# Patient Record
Sex: Female | Born: 1945 | Race: White | Hispanic: No | Marital: Married | Smoking: Never smoker
Health system: Southern US, Community
[De-identification: ages and names within clinical notes are randomized; demographics above are authoritative.]

## PROBLEM LIST (undated history)

## (undated) DIAGNOSIS — L570 Actinic keratosis: Secondary | ICD-10-CM

## (undated) HISTORY — DX: Actinic keratosis: L57.0

---

## 2004-11-20 ENCOUNTER — Ambulatory Visit: Payer: Self-pay | Admitting: Obstetrics and Gynecology

## 2005-11-23 ENCOUNTER — Ambulatory Visit: Payer: Self-pay | Admitting: Obstetrics and Gynecology

## 2006-12-06 ENCOUNTER — Ambulatory Visit: Payer: Self-pay | Admitting: Obstetrics and Gynecology

## 2007-12-12 ENCOUNTER — Ambulatory Visit: Payer: Self-pay | Admitting: Obstetrics and Gynecology

## 2008-12-16 ENCOUNTER — Ambulatory Visit: Payer: Self-pay | Admitting: Obstetrics and Gynecology

## 2009-12-22 ENCOUNTER — Ambulatory Visit: Payer: Self-pay | Admitting: Obstetrics and Gynecology

## 2010-12-29 ENCOUNTER — Ambulatory Visit: Payer: Self-pay | Admitting: Obstetrics and Gynecology

## 2011-11-26 ENCOUNTER — Ambulatory Visit: Payer: Self-pay | Admitting: Obstetrics and Gynecology

## 2012-09-26 ENCOUNTER — Emergency Department: Payer: Self-pay | Admitting: Emergency Medicine

## 2012-09-26 LAB — BASIC METABOLIC PANEL
Anion Gap: 5 — ABNORMAL LOW (ref 7–16)
BUN: 22 mg/dL — ABNORMAL HIGH (ref 7–18)
Calcium, Total: 8.6 mg/dL (ref 8.5–10.1)
Chloride: 108 mmol/L — ABNORMAL HIGH (ref 98–107)
Co2: 29 mmol/L (ref 21–32)
Creatinine: 0.78 mg/dL (ref 0.60–1.30)
EGFR (African American): >60
EGFR (Non-African Amer.): >60
Glucose: 95 mg/dL (ref 65–99)
Osmolality: 286 (ref 275–301)
Potassium: 3.6 mmol/L (ref 3.5–5.1)
Sodium: 142 mmol/L (ref 136–145)

## 2012-09-26 LAB — CBC
HCT: 34.6 % — ABNORMAL LOW (ref 35.0–47.0)
HGB: 12.1 g/dL (ref 12.0–16.0)
MCH: 32.6 pg (ref 26.0–34.0)
MCHC: 35.1 g/dL (ref 32.0–36.0)
MCV: 93 fL (ref 80–100)
Platelet: 214 10*3/uL (ref 150–440)
RBC: 3.72 10*6/uL — ABNORMAL LOW (ref 3.80–5.20)
RDW: 13.1 % (ref 11.5–14.5)
WBC: 7 10*3/uL (ref 3.6–11.0)

## 2012-09-26 LAB — TROPONIN I
Troponin-I: <0.02 ng/mL
Troponin-I: <0.02 ng/mL

## 2012-10-05 ENCOUNTER — Ambulatory Visit: Payer: Self-pay | Admitting: Unknown Physician Specialty

## 2012-10-06 LAB — COMPREHENSIVE METABOLIC PANEL
Albumin: 4 g/dL (ref 3.4–5.0)
Alkaline Phosphatase: 70 U/L (ref 50–136)
Anion Gap: 5 — ABNORMAL LOW (ref 7–16)
BUN: 23 mg/dL — ABNORMAL HIGH (ref 7–18)
Bilirubin,Total: 0.3 mg/dL (ref 0.2–1.0)
Calcium, Total: 8.7 mg/dL (ref 8.5–10.1)
Chloride: 107 mmol/L (ref 98–107)
Co2: 26 mmol/L (ref 21–32)
Creatinine: 0.89 mg/dL (ref 0.60–1.30)
EGFR (African American): >60
EGFR (Non-African Amer.): >60
Glucose: 148 mg/dL — ABNORMAL HIGH (ref 65–99)
Osmolality: 282 (ref 275–301)
Potassium: 4 mmol/L (ref 3.5–5.1)
SGOT(AST): 19 U/L (ref 15–37)
SGPT (ALT): 28 U/L (ref 12–78)
Sodium: 138 mmol/L (ref 136–145)
Total Protein: 7 g/dL (ref 6.4–8.2)

## 2012-10-06 LAB — CBC
HCT: 33 % — ABNORMAL LOW (ref 35.0–47.0)
HGB: 11.3 g/dL — ABNORMAL LOW (ref 12.0–16.0)
MCH: 32 pg (ref 26.0–34.0)
MCHC: 34.1 g/dL (ref 32.0–36.0)
MCV: 94 fL (ref 80–100)
Platelet: 225 10*3/uL (ref 150–440)
RBC: 3.51 10*6/uL — ABNORMAL LOW (ref 3.80–5.20)
RDW: 13.2 % (ref 11.5–14.5)
WBC: 9.7 10*3/uL (ref 3.6–11.0)

## 2012-10-06 LAB — PATHOLOGY REPORT

## 2012-10-07 ENCOUNTER — Inpatient Hospital Stay: Payer: Self-pay | Admitting: Internal Medicine

## 2012-10-07 LAB — HEMOGLOBIN
HGB: 10.3 g/dL — ABNORMAL LOW (ref 12.0–16.0)
HGB: 8.3 g/dL — ABNORMAL LOW (ref 12.0–16.0)

## 2012-10-07 LAB — PROTIME-INR
INR: 1
Prothrombin Time: 13.4 secs (ref 11.5–14.7)

## 2012-10-08 LAB — CBC WITH DIFFERENTIAL/PLATELET
Basophil #: 0 10*3/uL (ref 0.0–0.1)
Basophil %: 0.9 %
Eosinophil #: 0.1 10*3/uL (ref 0.0–0.7)
Eosinophil %: 2.9 %
HCT: 22.7 % — ABNORMAL LOW (ref 35.0–47.0)
HGB: 7.8 g/dL — ABNORMAL LOW (ref 12.0–16.0)
Lymphocyte #: 1.5 10*3/uL (ref 1.0–3.6)
Lymphocyte %: 29.7 %
MCH: 31.8 pg (ref 26.0–34.0)
MCHC: 34.3 g/dL (ref 32.0–36.0)
MCV: 93 fL (ref 80–100)
Monocyte #: 0.4 x10 3/mm (ref 0.2–0.9)
Monocyte %: 7.8 %
Neutrophil #: 3 10*3/uL (ref 1.4–6.5)
Neutrophil %: 58.7 %
Platelet: 167 10*3/uL (ref 150–440)
RBC: 2.45 10*6/uL — ABNORMAL LOW (ref 3.80–5.20)
RDW: 13.4 % (ref 11.5–14.5)
WBC: 5.1 10*3/uL (ref 3.6–11.0)

## 2012-10-08 LAB — BASIC METABOLIC PANEL
Anion Gap: 5 — ABNORMAL LOW (ref 7–16)
BUN: 13 mg/dL (ref 7–18)
Calcium, Total: 7.7 mg/dL — ABNORMAL LOW (ref 8.5–10.1)
Chloride: 112 mmol/L — ABNORMAL HIGH (ref 98–107)
Co2: 28 mmol/L (ref 21–32)
Creatinine: 0.89 mg/dL (ref 0.60–1.30)
EGFR (African American): >60
EGFR (Non-African Amer.): >60
Glucose: 92 mg/dL (ref 65–99)
Osmolality: 288 (ref 275–301)
Potassium: 3.5 mmol/L (ref 3.5–5.1)
Sodium: 145 mmol/L (ref 136–145)

## 2012-10-08 LAB — HEMOGLOBIN
HGB: 8 g/dL — ABNORMAL LOW (ref 12.0–16.0)
HGB: 7.7 g/dL — ABNORMAL LOW (ref 12.0–16.0)

## 2012-10-09 LAB — HEMOGLOBIN
HGB: 7.6 g/dL — ABNORMAL LOW (ref 12.0–16.0)
HGB: 7.6 g/dL — ABNORMAL LOW (ref 12.0–16.0)

## 2012-10-12 ENCOUNTER — Ambulatory Visit: Payer: Self-pay | Admitting: Specialist

## 2012-11-09 ENCOUNTER — Ambulatory Visit: Payer: Self-pay

## 2012-12-06 ENCOUNTER — Ambulatory Visit: Payer: Self-pay | Admitting: Internal Medicine

## 2013-09-04 DIAGNOSIS — D229 Melanocytic nevi, unspecified: Secondary | ICD-10-CM

## 2013-09-04 HISTORY — DX: Melanocytic nevi, unspecified: D22.9

## 2013-12-25 ENCOUNTER — Ambulatory Visit: Payer: Self-pay | Admitting: Internal Medicine

## 2014-06-18 ENCOUNTER — Ambulatory Visit: Payer: Self-pay | Admitting: General Practice

## 2014-07-19 NOTE — H&P (Signed)
PATIENT NAME:  Courtney Archer, Courtney Archer MR#:  [id number] DATE OF BIRTH:  May 10, 1945  DATE OF ADMISSION:  10/07/2012  PRIMARY CARE PHYSICIAN:  Dr. Ezequiel Kayser.   REFERRING PHYSICIAN:  Dr. Lurline Hare.   CHIEF COMPLAINT:  Bright red blood per rectum.   HISTORY OF PRESENT ILLNESS:  Courtney Archer is a 69 year old pleasant female with past medical history of gastroesophageal reflux disease, osteopenia, underwent a colonoscopy on 10/05/2012, removed a polyp of 1 cm and also a 3 mm sessile polyp.  The patient was also found to have internal hemorrhoids.  The patient did well until the 11th, evening, started to have one episode of bloody stools.  The patient was driving episode of bleeding with clot.  There was no fecal material.  Called Dr. Percell [place] office who did the colonoscopy, recommended to come to the Emergency Department.  After came back home the patient had another episode of bloody stools.  Work-up in the Emergency Department with a CBC, hemoglobin 11.2, which is decreased from 12.  Denies having any abdominal pain, nausea, vomiting.  The patient has normal BUN.   PAST MEDICAL HISTORY: 1.  Gastroesophageal reflux.  2.  Colonoscopy.  3.  Appendectomy.  4.  Tonsillectomy.   ALLERGIES:  No known drug allergies.   HOME MEDICATIONS: 1.  Omeprazole 20 mg 2 times a day.  2.  Lipitor 10 mg 1/2 tablet once a day.  3.  Aspirin 81 mg daily.   SOCIAL HISTORY:  No history of smoking, drinking, alcohol or using illicit drugs.  Married, lives with her husband, retired.   FAMILY HISTORY:  Mother died of bile duct cancer.  Father died of heart problems.   REVIEW OF SYSTEMS: CONSTITUTIONAL:  No generalized weakness, fatigued.  EYES:  No change in vision.  EARS, NOSE, THROAT:  No change in hearing.  No sore throat.  No runny nose. RESPIRATORY:  No cough, shortness of breath.  CARDIOVASCULAR:  No chest pain, palpitations.  No pedal edema.  GASTROINTESTINAL:  No nausea, vomiting.  Has  hematochezia. GENITOURINARY:  No dysuria or hematuria.  SKIN:  No rash or lesions.  ENDOCRINE:  No polyuria or polydipsia.  MUSCULOSKELETAL:  Good range of motion, has no joint pains and aches. NEUROLOGIC:  No weakness or numbness in any part of the body.  PSYCHIATRIC:  No depression.   PHYSICAL EXAMINATION: GENERAL:  This is a well-built, well-nourished, age-appropriate female lying down in the bed, not in distress.  VITAL SIGNS:  Temperature 98.2, pulse 60, blood pressure 136/77, respiratory rate of 16, oxygen saturation 100% on room air.  HEENT:  Head normocephalic, atraumatic.  Eyes, no scleral icterus.  Conjunctivae normal.  Pupils equal and react to light.  Mucous membranes moist.  No pharyngeal erythema.  NECK:  Supple.  No lymphadenopathy.  No JVD.  No carotid bruit.  No thyromegaly.  CHEST:  Has no focal tenderness.  LUNGS:  Bilateral clear to auscultation.  HEART:  S1, S2, regular.  No murmurs are heard.  Lower extremities, no pedal edema.  Pulses 2+ in the dorsalis pedis and posterior tibialis.  ABDOMEN:  Bowel sounds plus.  Soft, nontender, nondistended.  No hepatosplenomegaly.   RECTAL:  Exam done by the ER physician showed bright red blood.  SKIN:  No rash or lesions.  MUSCULOSKELETAL:  Good range of motion in all the extremities.  NEUROLOGIC:  The patient is alert, oriented to place, person and time.  Cranial nerves II through XII intact.  No motor and sensory  deficits.   LABORATORY DATA:  CBC, hemoglobin 11.3.  CMP is completely within normal limits.   ASSESSMENT AND PLAN:  Courtney Archer is a 69 year old female who comes to the Emergency Department with bright red blood per rectum.  1.  Gastrointestinal bleed.  This is most likely from the polyp removal site.  We will continue to follow up with hemoglobin and hematocrit.  Continue with IV fluids.  The patient is currently hemodynamically stable, not tachycardic.  2.  Anemia, from acute blood loss.  We will continue to  follow up.  3.  Keep the patient on DVT prophylaxis with SCDs.   TIME SPENT:  35 minutes.    ____________________________ Monica Becton, MD pv:ea D: 10/07/2012 02:06:11 ET T: 10/07/2012 02:41:10 ET JOB#: [id number]  cc: Monica Becton, MD, <Dictator> Christena Flake. Raechel Ache, MD Grier Mitts Annslee Tercero MD ELECTRONICALLY SIGNED 10/11/2012 0:23

## 2014-07-19 NOTE — Discharge Summary (Signed)
PATIENT NAME:  Courtney Archer, Courtney Archer MR#:  [id number] DATE OF BIRTH:  [birth year]  DATE OF ADMISSION:  10/07/2012 DATE OF DISCHARGE:  10/09/2012  DISCHARGE DIAGNOSES: 1.  Lower gastrointestinal bleeding due to mucosal ulcers, anemia due to gastrointestinal bleed.  2.  Gastroesophageal reflux disease. 3.  Colonoscopy done. Need to follow in 3 to 4 weeks.   CONDITION ON DISCHARGE: Stable.   DISCHARGE MEDICATIONS:  1.  Lipitor 10 mg oral tablet take 1/2 tablet once a day.  2.  Omeprazole 20 mg delayed-release tablet 2 times a day.  3.  Ferrous sulfate 325 mg oral tablet 3 times a day. 4.  Colace 100 mg capsule 2 times a day as needed for constipation.   DIET ON DISCHARGE: Regular. Diet consistency: Regular.   ACTIVITY LIMITATION: As tolerated.   TIMEFRAME TO FOLLOW-UP: Within 2 to 4 weeks with GI clinic. Advised to follow within 3 to 4 weeks with Dr. Vira Agar or Dr. Allen Norris.  HISTORY OF PRESENT ILLNESS: A 69 year old female with past medical history of gastroesophageal reflux disease, osteopenia. She underwent colonoscopy on 07/10 and removed a polyp.  She was also found to have an internal hemorrhoid. She did well for up to 24 hours and then started having episode of bloody stool.  She called Dr. Percell Boston office and they recommended to come to the Emergency Department.  She waited at home for another episode and after the second episode of bloody stools she came to the Emergency Department. In the Emergency Department, her hemoglobin was 11.2, which was decreased from 12. There was no abdominal pain, vomiting.   HOSPITAL COURSE AND STAY: She was admitted to regular medical floor and hemoglobin was followed regularly. GI consult with Dr. Allen Norris was done and he colonoscopy, found blood in entire examined colon, mucosal ulceration.  Injected and treated with thermal therapy.  After that she was started on clear liquids and she tolerated that so we upgraded that slowly and later on she was able to  tolerate the diet and her stool was clearing up. We discharged her home with advice to follow in GI clinic.   OTHER MEDICAL ISSUES IN THIS HOSPITAL STAY:  1.  Anemia due to blood loss.  It was mild and we advised to take oral supplement of iron therapy and get her hemoglobin checked later on with PMD. 2.  Gastroesophageal reflux disease. We continued Nexium orally on discharge.   LAB RESULTS:  Hemoglobin on presentation 11.3. Hemoglobin came down to 8.3 and 7.8 later on and then remained stable around 8.   TOTAL TIME SPENT ON THIS DISCHARGE: 45 minutes.  ____________________________ Ceasar Lund Anselm Jungling, MD vgv:sb D: 10/12/2012 07:42:54 ET T: 10/12/2012 07:54:46 ET JOB#: [id number]  cc: Ceasar Lund. Anselm Jungling, MD, <Dictator> Manya Silvas, MD Lucilla Lame, MD Rosalio Macadamia [facility] MD ELECTRONICALLY SIGNED 10/17/2012 16:08

## 2014-07-19 NOTE — Consult Note (Signed)
Brief Consult Note: Diagnosis: post polypectomy bleed.   Patient was seen by consultant.   Consult note dictated.   Comments: The patient will be prepped for a colonocopy for tomorrow.  Electronic Signatures: Lucilla Lame (MD)  (Signed 12-Jul-14 11:39)  Authored: Brief Consult Note   Last Updated: 12-Jul-14 11:39 by Lucilla Lame (MD)

## 2014-07-19 NOTE — Consult Note (Signed)
Chief Complaint:  Subjective/Chief Complaint Pt denies any abdominal pain, N/V.  Small amt of dark stool in 24 hrs.  Denies SOB, dizziness, CP or palpitations.   VITAL SIGNS/ANCILLARY NOTES: **Vital Signs.:   14-Jul-14 04:22  Vital Signs Type Routine  Temperature Temperature (F) 98.8  Celsius 37.1  Temperature Source oral  Pulse Pulse 64  Respirations Respirations 18  Systolic BP Systolic BP 709  Diastolic BP (mmHg) Diastolic BP (mmHg) 61  Mean BP 80  Pulse Ox % Pulse Ox % 98  Pulse Ox Activity Level  At rest  Oxygen Delivery Room Air/ 21 %   Brief Assessment:  GEN well developed, well nourished, no acute distress, A/Ox3   Cardiac Regular   Respiratory normal resp effort   Gastrointestinal Normal   Gastrointestinal details normal Soft  Nontender  Nondistended  Bowel sounds normal  No rebound tenderness  No gaurding   EXTR negative cyanosis/clubbing, negative edema   Additional Physical Exam Skin: Pink, warm, dry   Lab Results:  Routine Hem:  14-Jul-14 03:29   Hemoglobin (CBC)  7.6 (Result(s) reported on 09 Oct 2012 at 03:57AM.)   Assessment/Plan:  Assessment/Plan:  Assessment Post-polypectomy bleed:  s/p intervention by Dr Allen Norris.  Doing well. Anemia: secondary to acute bleed.  Hgb stable.   Plan 1) Recheck H/H @ 11, if stable consider DC home 2) Begin iron 325mg  daily 3) follow up with Dr Vira Agar in 2-3 weeks 4) pt may resume ASA in 5 DAYS   Electronic Signatures: Andria Meuse (NP)  (Signed 14-Jul-14 14:49)  Authored: Chief Complaint, VITAL SIGNS/ANCILLARY NOTES, Brief Assessment, Lab Results, Assessment/Plan   Last Updated: 14-Jul-14 14:49 by Andria Meuse (NP)

## 2014-07-19 NOTE — Consult Note (Signed)
PATIENT NAME:  Courtney Archer, Courtney Archer MR#:  [id number] DATE OF BIRTH:  03/23/1946  DATE OF CONSULTATION:  10/07/2012  REFERRING PHYSICIAN:   CONSULTING PHYSICIAN:  Lucilla Lame, MD  CONSULTING SERVICE:  Gastroenterology.  REASON FOR CONSULTATION:  Post polypectomy bleed.   HISTORY OF PRESENT ILLNESS:  This patient is a 69 year old woman who comes in after having a colonoscopy on the 10th of this month by Dr. Vira Agar.  At that time the patient had two polyps removed, one with cold snare and one with a hot snare.  The patient reports that the polyps were in the ascending colon and the cecum.  The patient also reports that she had three clips placed at the time of colonoscopy.  [place] hospital and started to have passed some clots.  She had called me Friday afternoon after noticing that she was passing blood.  The patient was told to go to the nearest Emergency Room if she continued to have bleeding or come back to our hospital.  The patient decided to drive the three hours back to our hospital and was admitted for the lower GI bleeding.  On admission, the patient denied any abdominal pain and her hemoglobin was 11.3.  The patient's polyps were above hyperplastic and there was not any sign of adenomas.  The patient denies any dizziness, shortness of breath, fevers, chills, nausea or vomiting.   PAST MEDICAL HISTORY:  Reflux, colonoscopy, appendectomy, tonsillectomy.   ALLERGIES:  No known drug allergies.   HOME MEDICATIONS:  Omeprazole, Lipitor, aspirin.   SOCIAL HISTORY:  No tobacco, alcohol or drug abuse.   FAMILY HISTORY:  Noncontributory for her GI bleed.   REVIEW OF SYSTEMS:  A 10 point review of systems was negative except what was stated above.   PHYSICAL EXAMINATION: GENERAL:  The patient is sitting in bed speaking in full sentences in no apparent distress. VITAL SIGNS:  Temperature 97.2, pulse 57, respirations  18, blood pressure 126/82, pulse oximetry 98%.  HEENT:  Normocephalic, atraumatic.  Extraocular motor intact.  Pupils equally round and reactive to light and accommodation without JVD, without lymphadenopathy.  LUNGS:  Clear to auscultation bilaterally.  HEART:  Regular rate and rhythm without murmurs, rubs or gallops.  ABDOMEN:  Soft, nontender, nondistended, without hepatosplenomegaly.  EXTREMITIES:  Without cyanosis, clubbing or edema.  NEUROLOGICAL:  Grossly intact.   MUSCULOSKELETAL:  Good range of motion.  SKIN:  Without any rashes or lesions.    ANCILLARY SERVICES:  As stated above.   ASSESSMENT AND PLAN:  This is a patient who is 69 years old and came in after having a colonoscopy by Dr. Vira Agar with two polyps removed that were found to be hyperplastic.  The patient has a post polypectomy bleed and will be prepped for a colonoscopy.  The patient has been explained the plan and agrees with it.   Thank you very much for involving me in the care of this patient.  If you have any questions, please do not hesitate to call.    ____________________________ Lucilla Lame, MD dw:ea D: 10/07/2012 20:35:58 ET T: 10/07/2012 23:40:57 ET JOB#: [id number]  cc: Lucilla Lame, MD, <Dictator> Lucilla Lame MD ELECTRONICALLY SIGNED 10/08/2012 6:38

## 2014-09-17 ENCOUNTER — Other Ambulatory Visit: Payer: Self-pay | Admitting: Internal Medicine

## 2014-09-17 DIAGNOSIS — Z1231 Encounter for screening mammogram for malignant neoplasm of breast: Secondary | ICD-10-CM

## 2015-01-07 ENCOUNTER — Ambulatory Visit
Admission: RE | Admit: 2015-01-07 | Discharge: 2015-01-07 | Disposition: A | Discharge status: Home / Self Care | Payer: Medicare Other | Source: Ambulatory Visit | Attending: Internal Medicine | Admitting: Internal Medicine

## 2015-01-07 DIAGNOSIS — Z1231 Encounter for screening mammogram for malignant neoplasm of breast: Secondary | ICD-10-CM | POA: Diagnosis present

## 2015-10-17 ENCOUNTER — Other Ambulatory Visit: Payer: Self-pay | Admitting: Internal Medicine

## 2015-10-17 DIAGNOSIS — Z1231 Encounter for screening mammogram for malignant neoplasm of breast: Secondary | ICD-10-CM

## 2015-11-13 ENCOUNTER — Other Ambulatory Visit: Payer: Self-pay | Admitting: Internal Medicine

## 2015-11-13 DIAGNOSIS — R911 Solitary pulmonary nodule: Secondary | ICD-10-CM

## 2015-11-14 ENCOUNTER — Ambulatory Visit
Admission: RE | Admit: 2015-11-14 | Discharge: 2015-11-14 | Disposition: A | Discharge status: Home / Self Care | Payer: Medicare HMO | Source: Ambulatory Visit | Attending: Internal Medicine | Admitting: Internal Medicine

## 2015-11-14 DIAGNOSIS — R911 Solitary pulmonary nodule: Secondary | ICD-10-CM

## 2015-11-14 DIAGNOSIS — R918 Other nonspecific abnormal finding of lung field: Secondary | ICD-10-CM | POA: Diagnosis not present

## 2015-11-14 MED ORDER — IOPAMIDOL (ISOVUE-300) INJECTION 61%
75.0000 mL | Freq: Once | INTRAVENOUS | Status: AC | PRN
  Administered 2015-11-14: 75 mL via INTRAVENOUS

## 2015-11-17 ENCOUNTER — Ambulatory Visit: Payer: Medicare Other

## 2015-11-18 ENCOUNTER — Ambulatory Visit: Payer: Medicare Other

## 2016-01-07 ENCOUNTER — Other Ambulatory Visit: Payer: Self-pay | Admitting: Internal Medicine

## 2016-01-07 ENCOUNTER — Ambulatory Visit
Admission: RE | Admit: 2016-01-07 | Discharge: 2016-01-07 | Disposition: A | Discharge status: Home / Self Care | Payer: Medicare HMO | Source: Ambulatory Visit | Attending: Internal Medicine | Admitting: Internal Medicine

## 2016-01-07 DIAGNOSIS — Z1231 Encounter for screening mammogram for malignant neoplasm of breast: Secondary | ICD-10-CM | POA: Diagnosis not present

## 2016-09-08 ENCOUNTER — Other Ambulatory Visit: Payer: Self-pay | Admitting: Internal Medicine

## 2016-09-08 DIAGNOSIS — Z1231 Encounter for screening mammogram for malignant neoplasm of breast: Secondary | ICD-10-CM

## 2017-01-26 ENCOUNTER — Ambulatory Visit: Payer: Medicare HMO

## 2017-02-02 ENCOUNTER — Ambulatory Visit
Admission: RE | Admit: 2017-02-02 | Discharge: 2017-02-02 | Disposition: A | Discharge status: Home / Self Care | Payer: Medicare HMO | Source: Ambulatory Visit | Attending: Internal Medicine | Admitting: Internal Medicine

## 2017-02-02 DIAGNOSIS — Z1231 Encounter for screening mammogram for malignant neoplasm of breast: Secondary | ICD-10-CM | POA: Insufficient documentation

## 2017-02-04 ENCOUNTER — Other Ambulatory Visit: Payer: Self-pay | Admitting: Internal Medicine

## 2017-02-04 DIAGNOSIS — N6320 Unspecified lump in the left breast, unspecified quadrant: Secondary | ICD-10-CM

## 2017-02-04 DIAGNOSIS — N6489 Other specified disorders of breast: Secondary | ICD-10-CM

## 2017-02-04 DIAGNOSIS — R928 Other abnormal and inconclusive findings on diagnostic imaging of breast: Secondary | ICD-10-CM

## 2017-02-15 ENCOUNTER — Ambulatory Visit
Admission: RE | Admit: 2017-02-15 | Discharge: 2017-02-15 | Disposition: A | Discharge status: Home / Self Care | Payer: Medicare HMO | Source: Ambulatory Visit | Attending: Internal Medicine | Admitting: Internal Medicine

## 2017-02-15 DIAGNOSIS — R928 Other abnormal and inconclusive findings on diagnostic imaging of breast: Secondary | ICD-10-CM

## 2017-02-15 DIAGNOSIS — N6489 Other specified disorders of breast: Secondary | ICD-10-CM

## 2017-02-15 DIAGNOSIS — N6320 Unspecified lump in the left breast, unspecified quadrant: Secondary | ICD-10-CM

## 2017-02-16 ENCOUNTER — Other Ambulatory Visit: Payer: Medicare HMO

## 2017-02-16 ENCOUNTER — Ambulatory Visit: Payer: Medicare HMO

## 2017-09-06 ENCOUNTER — Other Ambulatory Visit: Payer: Self-pay | Admitting: Internal Medicine

## 2017-09-06 DIAGNOSIS — Z1231 Encounter for screening mammogram for malignant neoplasm of breast: Secondary | ICD-10-CM

## 2018-02-07 ENCOUNTER — Encounter (INDEPENDENT_AMBULATORY_CARE_PROVIDER_SITE_OTHER): Payer: Self-pay

## 2018-02-07 ENCOUNTER — Ambulatory Visit
Admission: RE | Admit: 2018-02-07 | Discharge: 2018-02-07 | Disposition: A | Discharge status: Home / Self Care | Payer: Medicare HMO | Source: Ambulatory Visit | Attending: Internal Medicine | Admitting: Internal Medicine

## 2018-02-07 DIAGNOSIS — Z1231 Encounter for screening mammogram for malignant neoplasm of breast: Secondary | ICD-10-CM

## 2018-09-13 ENCOUNTER — Other Ambulatory Visit: Payer: Self-pay | Admitting: Internal Medicine

## 2018-09-13 DIAGNOSIS — Z1231 Encounter for screening mammogram for malignant neoplasm of breast: Secondary | ICD-10-CM

## 2019-02-06 ENCOUNTER — Other Ambulatory Visit: Payer: Self-pay

## 2019-02-06 ENCOUNTER — Ambulatory Visit
Admission: RE | Admit: 2019-02-06 | Discharge: 2019-02-06 | Disposition: A | Discharge status: Home / Self Care | Payer: Medicare HMO | Source: Ambulatory Visit | Attending: Internal Medicine | Admitting: Internal Medicine

## 2019-02-06 DIAGNOSIS — Z1231 Encounter for screening mammogram for malignant neoplasm of breast: Secondary | ICD-10-CM | POA: Insufficient documentation

## 2019-04-20 ENCOUNTER — Ambulatory Visit: Payer: Medicare HMO | Attending: Internal Medicine

## 2019-04-20 DIAGNOSIS — Z23 Encounter for immunization: Secondary | ICD-10-CM

## 2019-04-20 NOTE — Progress Notes (Signed)
   Covid-19 Vaccination Clinic  Name:  Courtney Archer    MRN: [id number] DOB: 10-17-1945  04/20/2019  Ms. Bonds was observed post Covid-19 immunization for 15 minutes without incidence. She was provided with Vaccine Information Sheet and instruction to access the V-Safe system.   [name] was instructed to call 911 with any severe reactions post vaccine: Marland Kitchen Difficulty breathing  . Swelling of your face and throat  . A fast heartbeat  . A bad rash all over your body  . Dizziness and weakness    Immunizations Administered    Name Date Dose VIS Date Route   Pfizer COVID-19 Vaccine 04/20/2019  4:31 PM 0.3 mL 03/09/2019 Intramuscular   Manufacturer: [place]   Lot: [id number]   [place]: [id number]

## 2019-05-11 ENCOUNTER — Ambulatory Visit: Payer: Medicare HMO | Attending: Internal Medicine

## 2019-05-11 ENCOUNTER — Ambulatory Visit: Payer: Medicare HMO

## 2019-05-11 DIAGNOSIS — Z23 Encounter for immunization: Secondary | ICD-10-CM

## 2019-05-11 NOTE — Progress Notes (Signed)
   Covid-19 Vaccination Clinic  Name:  Icel Chakrabarti    MRN: [id number] DOB: [birth year]  05/11/2019  Ms. Giraldo was observed post Covid-19 immunization for 15 minutes without incidence. She was provided with Vaccine Information Sheet and instruction to access the V-Safe system.   Ms. Sveum was instructed to call 911 with any severe reactions post vaccine: Marland Kitchen Difficulty breathing  . Swelling of your face and throat  . A fast heartbeat  . A bad rash all over your body  . Dizziness and weakness    Immunizations Administered    Name Date Dose VIS Date Route   Pfizer COVID-19 Vaccine 05/11/2019  9:39 AM 0.3 mL 03/09/2019 Intramuscular   Manufacturer: [place]   Lot: [id number]   [place]: [id number]

## 2019-05-14 ENCOUNTER — Ambulatory Visit: Payer: Medicare HMO

## 2019-07-10 ENCOUNTER — Other Ambulatory Visit: Payer: Self-pay

## 2019-07-10 ENCOUNTER — Ambulatory Visit: Payer: Medicare HMO | Admitting: Dermatology

## 2019-07-10 ENCOUNTER — Encounter: Payer: Self-pay | Admitting: Dermatology

## 2019-07-10 DIAGNOSIS — L814 Other melanin hyperpigmentation: Secondary | ICD-10-CM

## 2019-07-10 DIAGNOSIS — L821 Other seborrheic keratosis: Secondary | ICD-10-CM

## 2019-07-10 DIAGNOSIS — Z872 Personal history of diseases of the skin and subcutaneous tissue: Secondary | ICD-10-CM | POA: Diagnosis not present

## 2019-07-10 NOTE — Patient Instructions (Signed)
Prior to procedure, discussed risks of blister formation, small wound, skin dyspigmentation, or rare scar following cryotherapy.

## 2019-07-10 NOTE — Progress Notes (Signed)
   Follow-Up Visit   Subjective  Courtney Archer is a 74 y.o. female who presents for the following: Seborrheic Keratosis (3 months f/u Sk's on face, treated with LN2 not gone away ) and Actinic Keratosis (3 months f/u Ak treated on the nose with LN2, better ).  The following portions of the chart were reviewed this encounter and updated as appropriate: Allergies  Meds  Problems  Med Hx  Surg Hx  Fam Hx      Review of Systems: No other skin or systemic complaints.  Objective  Well appearing patient in no apparent distress; mood and affect are within normal limits.  A focused examination was performed including face, nose. Relevant physical exam findings are noted in the Assessment and Plan.  Objective  L mid dorsum nose: No sign of recurrence   Objective  R face (3): Stuck-on, waxy, tan-brown papules and plaques -- Discussed benign etiology and prognosis.   Assessment & Plan   Seborrheic Keratoses - Stuck-on, waxy, tan-brown papules and plaques  - Discussed benign etiology and prognosis. - Observe - Call for any changes Lentigines - Scattered tan macules - Discussed due to sun exposure - Benign, observe - Call for any changes      Hx of actinic keratosis L mid dorsum nose Clear. Observe for recurrence. Call clinic for new or changing lesions.  Recommend regular skin exams, daily broad-spectrum spf 30+ sunscreen use, and photoprotection.    Observe   Recommend daily broad spectrum sunscreen SPF 30+ to sun-exposed areas, reapply every 2 hours as needed. Call for new or changing lesions.   Seborrheic keratosis (3) Left face - some residual - touch up - no charge  R face - new non-treated cosmetic SKs pt wants treated today.  Pt will pay of pocket $90 for LN2  Destruction of lesion - R face Complexity: simple   Destruction method: cryotherapy   Informed consent: discussed and consent obtained   Timeout:  patient name, date of birth, surgical site,  and procedure verified Lesion destroyed using liquid nitrogen: Yes   Region frozen until ice ball extended beyond lesion: Yes   Outcome: patient tolerated procedure well with no complications   Post-procedure details: wound care instructions given    Return for as scheduled.   Documentation: I have reviewed the above documentation for accuracy and completeness, and I agree with the above.  Sarina Ser, MD

## 2019-09-12 ENCOUNTER — Other Ambulatory Visit: Payer: Self-pay | Admitting: Internal Medicine

## 2019-09-12 DIAGNOSIS — Z1231 Encounter for screening mammogram for malignant neoplasm of breast: Secondary | ICD-10-CM

## 2019-11-26 ENCOUNTER — Ambulatory Visit: Payer: Medicare HMO

## 2019-12-10 ENCOUNTER — Ambulatory Visit: Payer: Medicare HMO

## 2020-01-08 ENCOUNTER — Other Ambulatory Visit: Payer: Self-pay

## 2020-01-08 ENCOUNTER — Ambulatory Visit
Admission: RE | Admit: 2020-01-08 | Discharge: 2020-01-08 | Disposition: A | Discharge status: Home / Self Care | Payer: Medicare HMO | Source: Ambulatory Visit | Attending: Internal Medicine | Admitting: Internal Medicine

## 2020-01-08 DIAGNOSIS — Z1231 Encounter for screening mammogram for malignant neoplasm of breast: Secondary | ICD-10-CM | POA: Diagnosis not present

## 2020-02-04 ENCOUNTER — Ambulatory Visit: Payer: Medicare HMO

## 2020-04-23 ENCOUNTER — Ambulatory Visit: Payer: Medicare HMO | Admitting: Dermatology

## 2020-04-23 ENCOUNTER — Encounter: Payer: Self-pay | Admitting: Dermatology

## 2020-04-23 ENCOUNTER — Other Ambulatory Visit: Payer: Self-pay

## 2020-04-23 DIAGNOSIS — L814 Other melanin hyperpigmentation: Secondary | ICD-10-CM

## 2020-04-23 DIAGNOSIS — D1800 Hemangioma unspecified site: Secondary | ICD-10-CM

## 2020-04-23 DIAGNOSIS — L578 Other skin changes due to chronic exposure to nonionizing radiation: Secondary | ICD-10-CM

## 2020-04-23 DIAGNOSIS — D229 Melanocytic nevi, unspecified: Secondary | ICD-10-CM

## 2020-04-23 DIAGNOSIS — L821 Other seborrheic keratosis: Secondary | ICD-10-CM

## 2020-04-23 DIAGNOSIS — Z86018 Personal history of other benign neoplasm: Secondary | ICD-10-CM

## 2020-04-23 DIAGNOSIS — D692 Other nonthrombocytopenic purpura: Secondary | ICD-10-CM

## 2020-04-23 DIAGNOSIS — Z1283 Encounter for screening for malignant neoplasm of skin: Secondary | ICD-10-CM

## 2020-04-23 NOTE — Patient Instructions (Signed)
Recommend Dermend Bruise formula for purpura.

## 2020-04-23 NOTE — Progress Notes (Signed)
   Follow-Up Visit   Subjective  Courtney Archer is a 75 y.o. female who presents for the following: Annual Exam (Hx dysplastic nevus). The patient presents for Total-Body Skin Exam (TBSE) for skin cancer screening and mole check.  The following portions of the chart were reviewed this encounter and updated as appropriate:   Tobacco  Allergies  Meds  Problems  Med Hx  Surg Hx  Fam Hx     Review of Systems:  No other skin or systemic complaints except as noted in HPI or Assessment and Plan.  Objective  Well appearing patient in no apparent distress; mood and affect are within normal limits.  A full examination was performed including scalp, head, eyes, ears, nose, lips, neck, chest, axillae, abdomen, back, buttocks, bilateral upper extremities, bilateral lower extremities, hands, feet, fingers, toes, fingernails, and toenails. All findings within normal limits unless otherwise noted below.   Assessment & Plan    Lentigines - Scattered tan macules - Discussed due to sun exposure - Benign, observe - Call for any changes  Seborrheic Keratoses - Stuck-on, waxy, tan-brown papules and plaques  - Discussed benign etiology and prognosis. - Observe - Call for any changes  Melanocytic Nevi - Tan-brown and/or pink-flesh-colored symmetric macules and papules - Benign appearing on exam today - Observation - Call clinic for new or changing moles - Recommend daily use of broad spectrum spf 30+ sunscreen to sun-exposed areas.   Hemangiomas - Red papules - Discussed benign nature - Observe - Call for any changes  Actinic Damage - Chronic, secondary to cumulative UV/sun exposure - diffuse scaly erythematous macules with underlying dyspigmentation - Recommend daily broad spectrum sunscreen SPF 30+ to sun-exposed areas, reapply every 2 hours as needed.  - Call for new or changing lesions.  History of Dysplastic Nevus  - No evidence of recurrence today - Recommend regular  full body skin exams - Recommend daily broad spectrum sunscreen SPF 30+ to sun-exposed areas, reapply every 2 hours as needed.  - Call if any new or changing lesions are noted between office visits   Purpura - Chronic; persistent and recurrent.  Treatable, but not curable. - Violaceous macules and patches - Benign - Related to trauma, age, sun damage and/or use of blood thinners, chronic use of topical and/or oral steroids - Observe - Can use OTC arnica containing moisturizer such as Dermend Bruise Formula if desired - Call for worsening or other concerns  Skin cancer screening performed today.  Return in about 1 year (around 04/23/2021) for TBSE - hx dysplastic nevus .  Luther Redo, CMA, am acting as scribe for Sarina Ser, MD .  Documentation: I have reviewed the above documentation for accuracy and completeness, and I agree with the above.  Sarina Ser, MD

## 2020-04-25 ENCOUNTER — Encounter: Payer: Self-pay | Admitting: Dermatology

## 2020-09-23 ENCOUNTER — Other Ambulatory Visit: Payer: Self-pay

## 2020-09-23 ENCOUNTER — Ambulatory Visit
Admission: EM | Admit: 2020-09-23 | Discharge: 2020-09-23 | Disposition: A | Discharge status: Home / Self Care | Payer: Medicare HMO

## 2020-09-23 DIAGNOSIS — J309 Allergic rhinitis, unspecified: Secondary | ICD-10-CM

## 2020-09-23 DIAGNOSIS — R0982 Postnasal drip: Secondary | ICD-10-CM | POA: Diagnosis not present

## 2020-09-23 MED ORDER — IPRATROPIUM BROMIDE 0.06 % NA SOLN: 2.0000 | Freq: Four times a day (QID) | NASAL | 12 refills | Status: AC

## 2020-09-23 NOTE — ED Triage Notes (Signed)
Pt c/o cough for several weeks. She states her cough is worse at night, mostly dry. She was exposed to COVID several weeks ago but has tested negative. Pt denies nasal congestion, f/n/v/d or other symptoms.

## 2020-09-23 NOTE — Discharge Instructions (Addendum)
Use the Atrovent nasal spray, 2 squirts in each nostril every 6 hours, as needed for runny nose and postnasal drip.  Resume Zyrtec 10 mg daily to help with post-nasal drip.   Return for reevaluation or see your primary care provider for any new or worsening symptoms.

## 2020-09-23 NOTE — ED Provider Notes (Signed)
[facility]    CSN: [id number] Arrival date & time: 09/23/20  0802      History   Chief Complaint Chief Complaint  Patient presents with   Cough    HPI Courtney Archer is a 75 y.o. female.   HPI  75 year old female here for evaluation of cough.  Patient ports that she has been experiencing a cough is mainly at night and nonproductive for the past several weeks.  She was exposed to [place] on a trip at the onset of symptoms but tested negative.  She denies any fever, postnasal drip, sore throat, nasal congestion, ear pain, shortness breath or wheezing, body aches, or GI complaints.  She does endorse slight runny nose in the morning only.  She reports that she is getting ready go see her grandchildren and just wants to make sure that she has nothing infectious that she might take to them.  Past Medical History:  Diagnosis Date   Actinic keratosis    Atypical mole 09/04/2013   Right med. prox. thigh. Pigmented spindle cell nevus, moderately atypical, limited margins free.    There are no problems to display for this patient.   History reviewed. No pertinent surgical history.  OB History   No obstetric history on file.      Home Medications    Prior to Admission medications   Medication Sig Start Date End Date Taking? Authorizing Provider  atorvastatin (LIPITOR) 10 MG tablet Take by mouth. 10/16/15 09/23/20 Yes [provider]  ipratropium (ATROVENT) 0.06 % nasal spray Place 2 sprays into both nostrils 4 (four) times daily. 09/23/20  Yes Margarette Canada, NP  nortriptyline (PAMELOR) 10 MG capsule Take by mouth. 03/17/20 09/23/20 Yes [provider]  omeprazole (PRILOSEC) 20 MG capsule Take by mouth. 10/16/15  Yes [provider]  aspirin 81 MG EC tablet Take by mouth.    [provider]  estradiol (ESTRACE) 0.1 MG/GM vaginal cream INSERT 4 CLICKS (= 1ML) VAGINALLY AT BEDTIME TWO TIMES PER WEEK -REFILLS 48HR NOTICE - CUSTOM  COMPOUND 05/12/20   [provider]    Family History Family History  Problem Relation Age of Onset   Breast cancer Maternal Aunt 70    Social History Social History   Tobacco Use   Smoking status: Never   Smokeless tobacco: Never  Vaping Use   Vaping Use: Never used     Allergies   Patient has no known allergies.   Review of Systems Review of Systems  Constitutional:  Negative for activity change, appetite change and fever.  HENT:  Positive for rhinorrhea. Negative for congestion, ear pain and sore throat.   Respiratory:  Positive for cough. Negative for shortness of breath and wheezing.   Gastrointestinal:  Negative for diarrhea, nausea and vomiting.  Musculoskeletal:  Negative for arthralgias and myalgias.  Skin:  Negative for rash.  Hematological: Negative.   Psychiatric/Behavioral: Negative.      Physical Exam Triage Vital Signs ED Triage Vitals  Enc Vitals Group     BP 09/23/20 0822 121/81     Pulse Rate 09/23/20 0822 79     Resp 09/23/20 0822 18     Temp 09/23/20 0822 98.3 F (36.8 C)     Temp Source 09/23/20 0822 Oral     SpO2 09/23/20 0822 99 %     Weight 09/23/20 0817 132 lb (59.9 kg)     Height 09/23/20 0817 5\' 5"  (1.651 m)     Head Circumference --  Peak Flow --      Pain Score 09/23/20 0817 0     Pain Loc --      Pain Edu? --      Excl. in [place]? --    No data found.  Updated Vital Signs BP 121/81 (BP Location: Left Arm)   Pulse 79   Temp 98.3 F (36.8 C) (Oral)   Resp 18   Ht 5\' 5"  (1.651 m)   Wt 132 lb (59.9 kg)   SpO2 99%   BMI 21.97 kg/m   Visual Acuity Right Eye Distance:   Left Eye Distance:   Bilateral Distance:    Right Eye Near:   Left Eye Near:    Bilateral Near:     Physical Exam Vitals and nursing note reviewed.  Constitutional:      General: She is not in acute distress.    Appearance: Normal appearance. She is normal weight. She is not ill-appearing.  HENT:     Head: Normocephalic and atraumatic.      Right Ear: Tympanic membrane, ear canal and external ear normal. There is no impacted cerumen.     Left Ear: Tympanic membrane, ear canal and external ear normal. There is no impacted cerumen.     Nose: Congestion and rhinorrhea present.     Mouth/Throat:     Mouth: Mucous membranes are moist.     Pharynx: Oropharynx is clear. No posterior oropharyngeal erythema.  Cardiovascular:     Rate and Rhythm: Normal rate and regular rhythm.     Pulses: Normal pulses.     Heart sounds: Normal heart sounds. No murmur heard.   No gallop.  Pulmonary:     Effort: Pulmonary effort is normal.     Breath sounds: Normal breath sounds. No wheezing, rhonchi or rales.  Musculoskeletal:     Cervical back: Normal range of motion and neck supple.  Lymphadenopathy:     Cervical: No cervical adenopathy.  Skin:    General: Skin is warm and dry.     Capillary Refill: Capillary refill takes less than 2 seconds.     Findings: No rash.  Neurological:     General: No focal deficit present.     Mental Status: She is alert and oriented to person, place, and time.  Psychiatric:        Mood and Affect: Mood normal.        Behavior: Behavior normal.        Thought Content: Thought content normal.        Judgment: Judgment normal.     UC Treatments / Results  Labs (all labs ordered are listed, but only abnormal results are displayed) Labs Reviewed - No data to display  EKG   Radiology No results found.  Procedures Procedures (including critical care time)  Medications Ordered in UC Medications - No data to display  Initial Impression / Assessment and Plan / UC Course  I have reviewed the triage vital signs and the nursing notes.  Pertinent labs & imaging results that were available during my care of the patient were reviewed by me and considered in my medical decision making (see chart for details).  Patient is a very pleasant and nontoxic-appearing 75 year old female here for evaluation of  cough as outlined in HPI above.  Patient's physical exam reveals pearly gray tympanic membranes bilaterally with a normal light reflex and clear external auditory canals.  Nasal mucosa is pale and edematous with mild clear nasal discharge.  Oropharyngeal exam  reveals clear postnasal drip without erythema or injection of the posterior oropharynx.  No cervical lymphadenopathy appreciated exam.  Cardiopulmonary exam is benign.  Patient's exam is consistent with allergic rhinitis and postnasal drip.  We will treat with Atrovent nasal spray and have patient resume her Zyrtec.   Final Clinical Impressions(s) / UC Diagnoses   Final diagnoses:  Allergic rhinitis with postnasal drip     Discharge Instructions      Use the Atrovent nasal spray, 2 squirts in each nostril every 6 hours, as needed for runny nose and postnasal drip.  Resume Zyrtec 10 mg daily to help with post-nasal drip.   Return for reevaluation or see your primary care provider for any new or worsening symptoms.      ED Prescriptions     Medication Sig Dispense Auth. Provider   ipratropium (ATROVENT) 0.06 % nasal spray Place 2 sprays into both nostrils 4 (four) times daily. 15 mL Margarette Canada, NP      PDMP not reviewed this encounter.   Margarette Canada, NP 09/23/20 [phone]

## 2020-11-05 IMAGING — MG DIGITAL SCREENING BILATERAL MAMMOGRAM WITH TOMO AND CAD
8 series · 9 of 24 positions shown · non-contrast
Comparison: Previous exam(s).

CLINICAL DATA: Screening.

EXAM:
DIGITAL SCREENING BILATERAL MAMMOGRAM WITH TOMO AND CAD

[L MLO synth-2D]
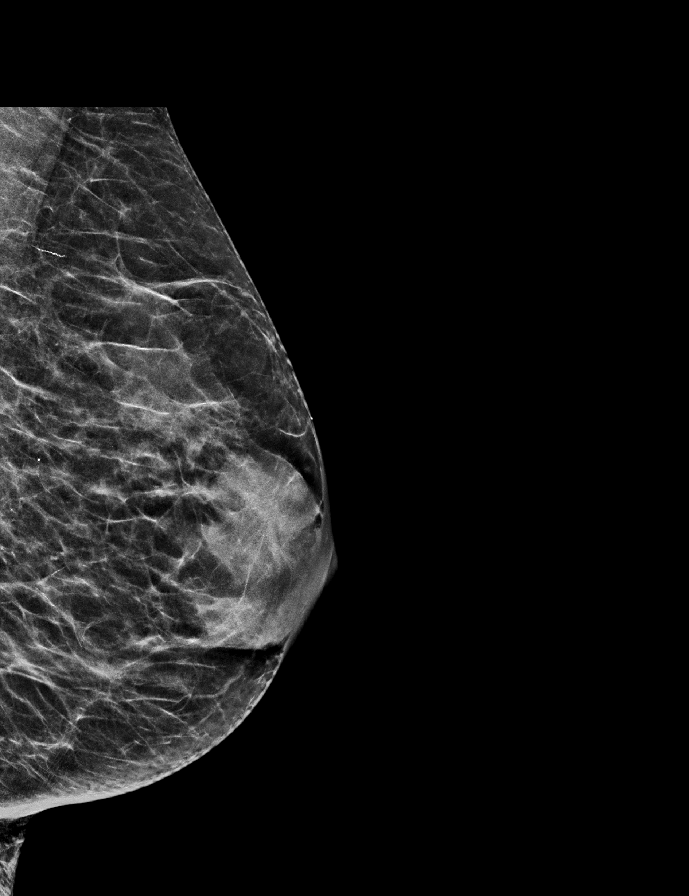

[R CC synth-2D]
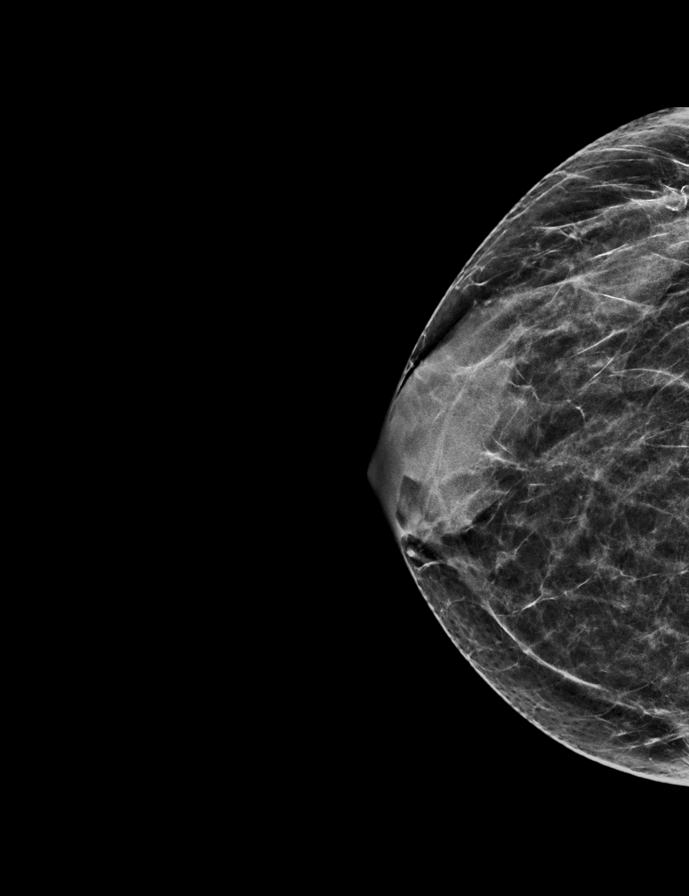

[L CC synth-2D]
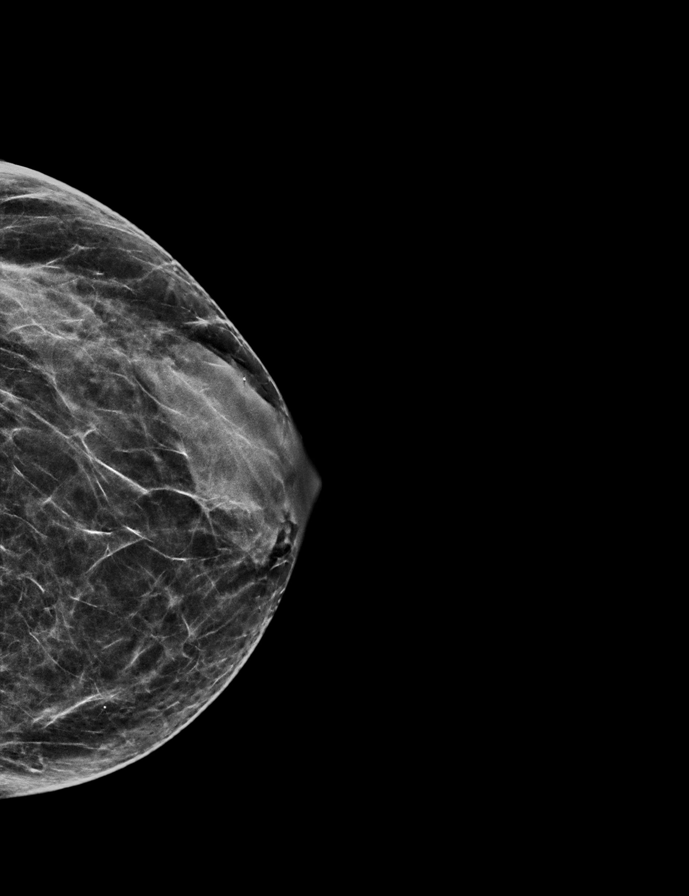

[R MLO synth-2D]
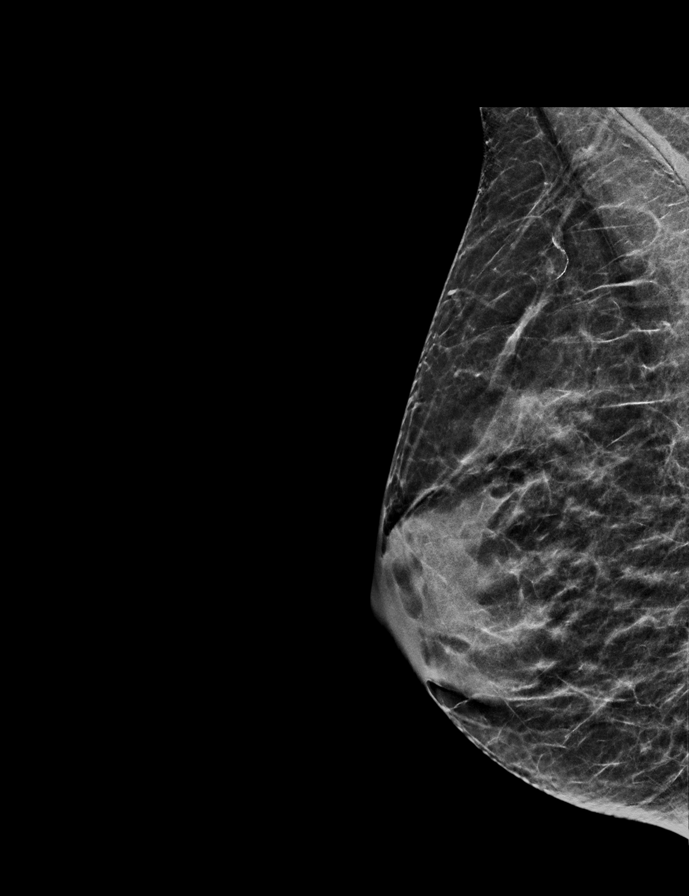

[R CC tomo · 2 of 48 frames shown]
[frame 16/48]
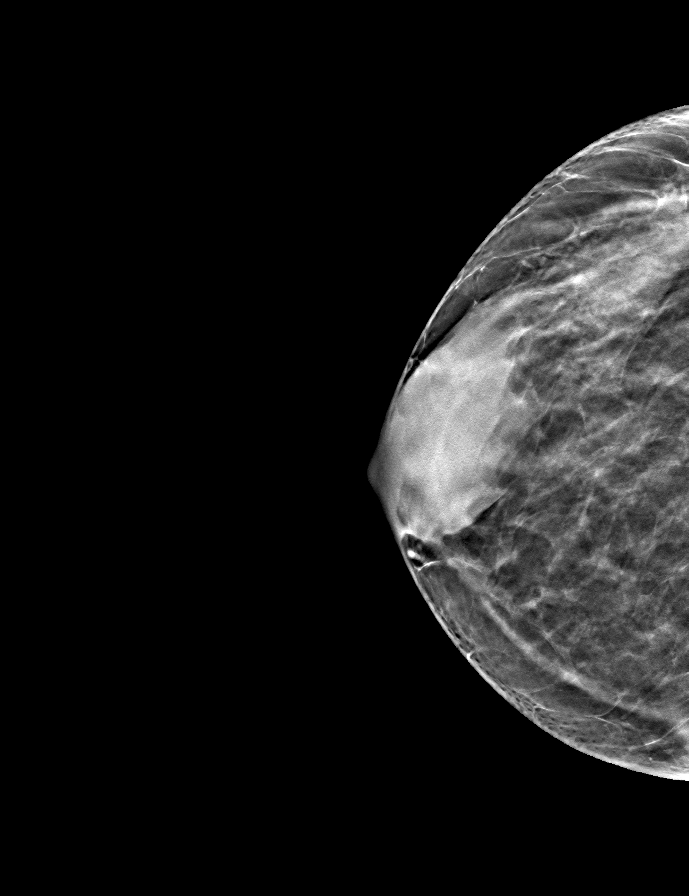
[frame 25/48]
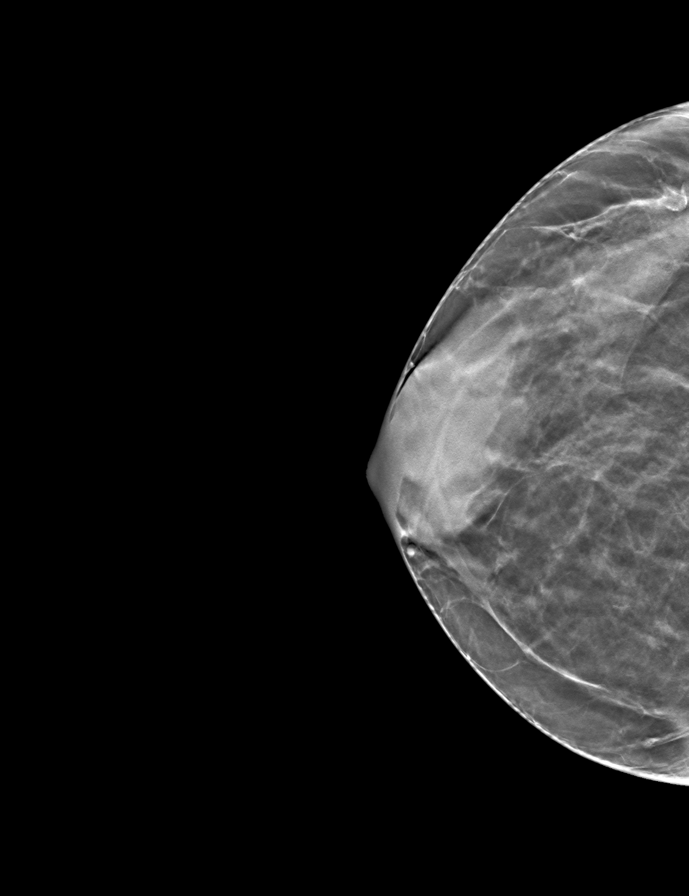

[L CC tomo · tomo slice 27/53.0]
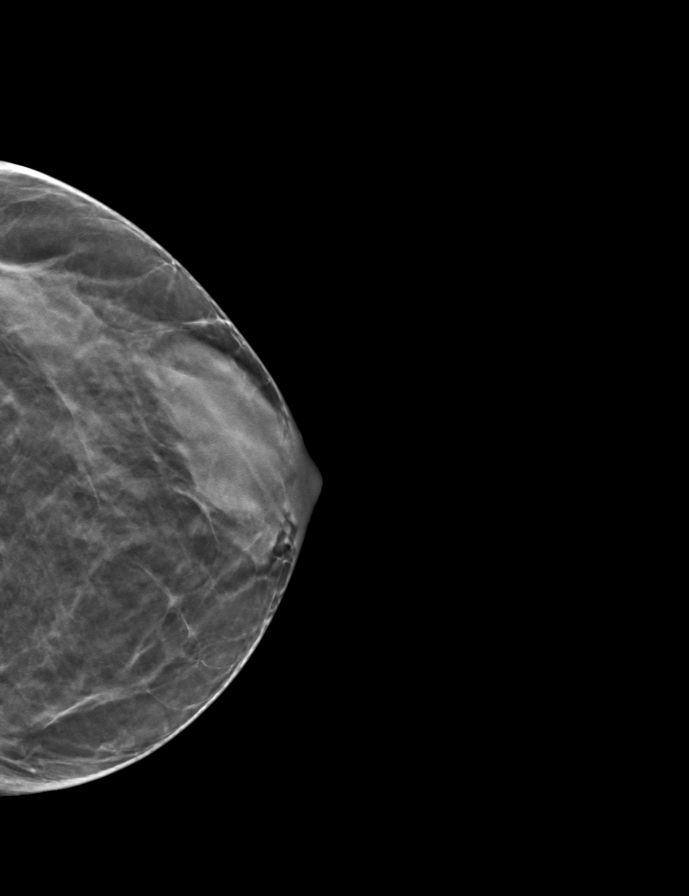

[L MLO tomo · tomo slice 25/50.0]
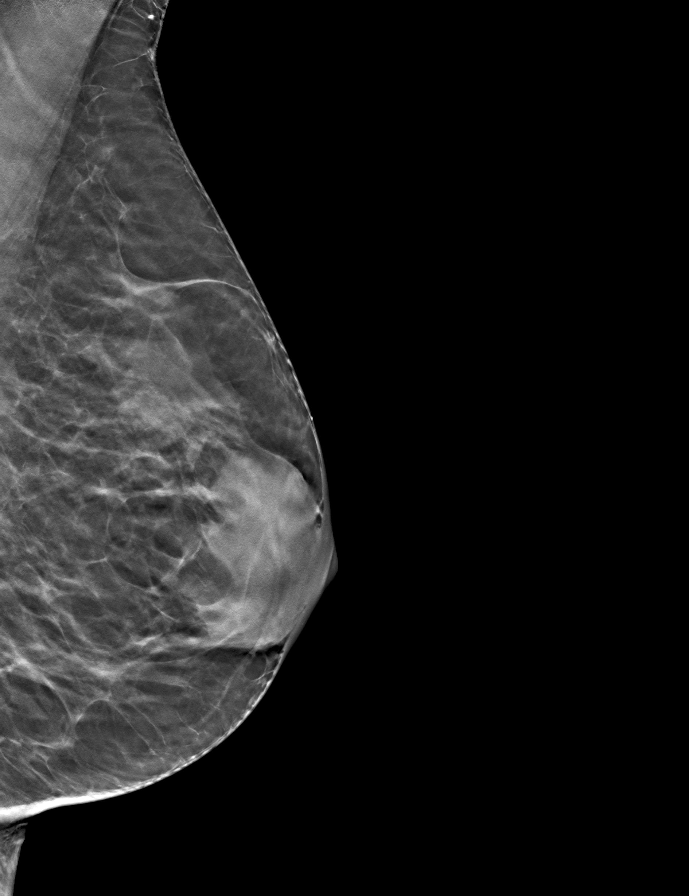

[R MLO tomo · tomo slice 25/48.0]
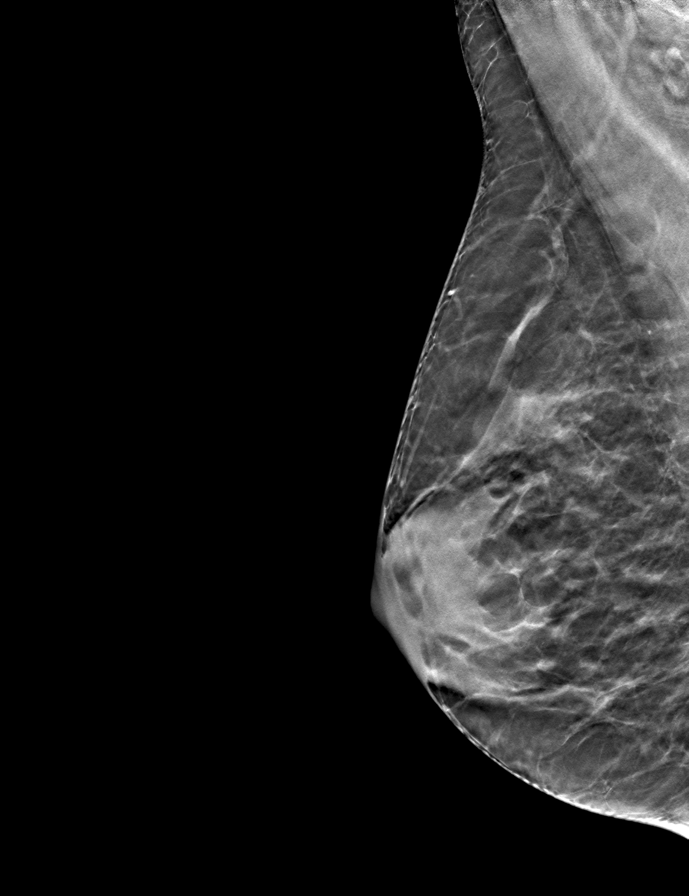

[9 of 24 positions shown; findings below may reference images not displayed]

ACR Breast Density Category d: The breast tissue is extremely dense,
which lowers the sensitivity of mammography.
FINDINGS: There are no findings suspicious for malignancy. Images were
processed with CAD.
IMPRESSION: No mammographic evidence of malignancy. A result letter of this
screening mammogram will be mailed directly to the patient.

RECOMMENDATION:
Screening mammogram in one year. (Code:RA-I-AVB)

BI-RADS CATEGORY  1: Negative.

## 2021-01-14 ENCOUNTER — Other Ambulatory Visit: Payer: Self-pay | Admitting: Internal Medicine

## 2021-01-14 DIAGNOSIS — Z1231 Encounter for screening mammogram for malignant neoplasm of breast: Secondary | ICD-10-CM

## 2021-02-04 ENCOUNTER — Other Ambulatory Visit: Payer: Self-pay

## 2021-02-04 ENCOUNTER — Ambulatory Visit
Admission: RE | Admit: 2021-02-04 | Discharge: 2021-02-04 | Disposition: A | Discharge status: Home / Self Care | Payer: Medicare HMO | Source: Ambulatory Visit | Attending: Internal Medicine | Admitting: Internal Medicine

## 2021-02-04 DIAGNOSIS — Z1231 Encounter for screening mammogram for malignant neoplasm of breast: Secondary | ICD-10-CM | POA: Insufficient documentation

## 2021-04-29 ENCOUNTER — Other Ambulatory Visit: Payer: Self-pay

## 2021-04-29 ENCOUNTER — Ambulatory Visit: Payer: Medicare HMO | Admitting: Dermatology

## 2021-04-29 DIAGNOSIS — D229 Melanocytic nevi, unspecified: Secondary | ICD-10-CM

## 2021-04-29 DIAGNOSIS — L578 Other skin changes due to chronic exposure to nonionizing radiation: Secondary | ICD-10-CM

## 2021-04-29 DIAGNOSIS — L820 Inflamed seborrheic keratosis: Secondary | ICD-10-CM

## 2021-04-29 DIAGNOSIS — L821 Other seborrheic keratosis: Secondary | ICD-10-CM | POA: Diagnosis not present

## 2021-04-29 DIAGNOSIS — Z1283 Encounter for screening for malignant neoplasm of skin: Secondary | ICD-10-CM

## 2021-04-29 DIAGNOSIS — D1800 Hemangioma unspecified site: Secondary | ICD-10-CM

## 2021-04-29 DIAGNOSIS — L814 Other melanin hyperpigmentation: Secondary | ICD-10-CM

## 2021-04-29 DIAGNOSIS — D692 Other nonthrombocytopenic purpura: Secondary | ICD-10-CM | POA: Diagnosis not present

## 2021-04-29 DIAGNOSIS — Z86018 Personal history of other benign neoplasm: Secondary | ICD-10-CM

## 2021-04-29 NOTE — Patient Instructions (Signed)

## 2021-04-29 NOTE — Progress Notes (Signed)
Follow-Up Visit   Subjective  Courtney Archer is a 76 y.o. female who presents for the following: Annual Exam (Hx dysplastic nevus - L upper eyelid new growth, grows larger, itches). The patient presents for Total-Body Skin Exam (TBSE) for skin cancer screening and mole check.  The patient has spots, moles and lesions to be evaluated, some may be new or changing.   The following portions of the chart were reviewed this encounter and updated as appropriate:   Tobacco   Allergies   Meds   Problems   Med Hx   Surg Hx   Fam Hx      Review of Systems:  No other skin or systemic complaints except as noted in HPI or Assessment and Plan.  Objective  Well appearing patient in no apparent distress; mood and affect are within normal limits.  A full examination was performed including scalp, head, eyes, ears, nose, lips, neck, chest, axillae, abdomen, back, buttocks, bilateral upper extremities, bilateral lower extremities, hands, feet, fingers, toes, fingernails, and toenails. All findings within normal limits unless otherwise noted below.  L upper eyelid margin x 1 Erythematous stuck-on, waxy papule or plaque  L brow x 1, dorsum nose x 1 (2) Erythematous stuck-on, waxy papule or plaque   Assessment & Plan  Inflamed seborrheic keratosis L upper eyelid margin x 1  Destruction of lesion - L upper eyelid margin x 1 Complexity: simple   Destruction method: cryotherapy   Informed consent: discussed and consent obtained   Timeout:  patient name, date of birth, surgical site, and procedure verified Lesion destroyed using liquid nitrogen: Yes   Region frozen until ice ball extended beyond lesion: Yes   Outcome: patient tolerated procedure well with no complications   Post-procedure details: wound care instructions given    Seborrheic keratosis, inflamed (2) L brow x 1, dorsum nose x 1  Destruction of lesion - L brow x 1, dorsum nose x 1 Complexity: simple   Destruction method:  cryotherapy   Informed consent: discussed and consent obtained   Timeout:  patient name, date of birth, surgical site, and procedure verified Lesion destroyed using liquid nitrogen: Yes   Region frozen until ice ball extended beyond lesion: Yes   Outcome: patient tolerated procedure well with no complications   Post-procedure details: wound care instructions given    Skin cancer screening  Purpura - Chronic; persistent and recurrent.  Treatable, but not curable. - Violaceous macules and patches - Benign - Related to trauma, age, sun damage and/or use of blood thinners, chronic use of topical and/or oral steroids - Observe - Can use OTC arnica containing moisturizer such as Dermend Bruise Formula if desired - Call for worsening or other concerns  Lentigines - Scattered tan macules - Due to sun exposure - Benign-appearing, observe - Recommend daily broad spectrum sunscreen SPF 30+ to sun-exposed areas, reapply every 2 hours as needed. - Call for any changes - Discussed the treatment option of BBL/laser.  Typically we recommend 1-3 treatment sessions about 5-8 weeks apart for best results.  The patient's condition may require "maintenance treatments" in the future.  The fee for BBL / laser treatments is $350 per treatment session for the whole face.  A fee can be quoted for other parts of the body. Insurance typically does not pay for BBL/laser treatments and therefore the fee is an out-of-pocket cost.  Seborrheic Keratoses - Stuck-on, waxy, tan-brown papules and/or plaques  - Benign-appearing - Discussed benign etiology and prognosis. -  Observe - Call for any changes  Melanocytic Nevi - Tan-brown and/or pink-flesh-colored symmetric macules and papules - Benign appearing on exam today - Observation - Call clinic for new or changing moles - Recommend daily use of broad spectrum spf 30+ sunscreen to sun-exposed areas.   Hemangiomas - Red papules - Discussed benign nature -  Observe - Call for any changes  Actinic Damage - Chronic condition, secondary to cumulative UV/sun exposure - diffuse scaly erythematous macules with underlying dyspigmentation - Recommend daily broad spectrum sunscreen SPF 30+ to sun-exposed areas, reapply every 2 hours as needed.  - Staying in the shade or wearing long sleeves, sun glasses (UVA+UVB protection) and wide brim hats (4-inch brim around the entire circumference of the hat) are also recommended for sun protection.  - Call for new or changing lesions.  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  - Call if any new or changing lesions are noted between office visits  Skin cancer screening performed today.  Return in about 1 year (around 04/29/2022) for TBSE.  Luther Redo, CMA, am acting as scribe for Sarina Ser, MD . Documentation: I have reviewed the above documentation for accuracy and completeness, and I agree with the above.  Sarina Ser, MD

## 2021-05-02 ENCOUNTER — Encounter: Payer: Self-pay | Admitting: Dermatology

## 2021-11-17 ENCOUNTER — Ambulatory Visit
Admission: EM | Admit: 2021-11-17 | Discharge: 2021-11-17 | Disposition: A | Discharge status: Home / Self Care | Payer: Medicare HMO | Attending: Emergency Medicine | Admitting: Emergency Medicine

## 2021-11-17 DIAGNOSIS — J020 Streptococcal pharyngitis: Secondary | ICD-10-CM | POA: Insufficient documentation

## 2021-11-17 LAB — GROUP A STREP BY PCR: Group A Strep by PCR: DETECTED — AB

## 2021-11-17 MED ORDER — AMOXICILLIN 500 MG PO CAPS: 500.0000 mg | ORAL_CAPSULE | Freq: Two times a day (BID) | ORAL | 0 refills | Status: AC

## 2021-11-17 NOTE — ED Triage Notes (Signed)
Pt c/o sore throat x3days  Pt asks for a strep test to be done.

## 2021-11-17 NOTE — Discharge Instructions (Signed)
Today your throat swab was positive for strep which is a bacteria  Take amoxicillin every morning and every evening for 10 days, ideally should begin to see improvement in about 24 to 48 hours and steady progression from there  You may use Tylenol 500 mg every 6 hours and ibuprofen 400 to 600 mg every 6 hours for management of pain  May attempt salt water gargles, soft foods, warm to cool liquids to preference, over-the-counter Chloraseptic spray and teaspoons of honey for additional support  You may follow-up with urgent care if symptoms persist or worsen

## 2021-11-17 NOTE — ED Provider Notes (Signed)
[facility]    CSN: [id number] Arrival date & time: 11/17/21  9509      History   Chief Complaint Chief Complaint  Patient presents with   Sore Throat    HPI Courtney Archer is a 76 y.o. female.   Patient presents with sore throat, rhinorrhea and amount nonproductive cough for 3 days.  Symptoms worsened overnight interfering with sleep.  No known sick contacts.  Tolerating food and liquids.  Has been using Motrin which has been effective.  No pertinent medical history.  Denies fever, chills, body aches, shortness of breath, wheezing, ear pain.  Past Medical History:  Diagnosis Date   Actinic keratosis    Atypical mole 09/04/2013   Right med. prox. thigh. Pigmented spindle cell nevus, moderately atypical, limited margins free.    There are no problems to display for this patient.   History reviewed. No pertinent surgical history.  OB History   No obstetric history on file.      Home Medications    Prior to Admission medications   Medication Sig Start Date End Date Taking? Authorizing Provider  aspirin 81 MG EC tablet Take by mouth.   Yes [provider]  estradiol (ESTRACE) 0.1 MG/GM vaginal cream INSERT 4 CLICKS (= 1ML) VAGINALLY AT BEDTIME TWO TIMES PER WEEK -REFILLS 48HR NOTICE - CUSTOM COMPOUND 05/12/20  Yes [provider]  gabapentin (NEURONTIN) 100 MG capsule Take 100 mg by mouth at bedtime. 04/24/21  Yes [provider]  omeprazole (PRILOSEC) 20 MG capsule Take by mouth. 10/16/15  Yes [provider]  atorvastatin (LIPITOR) 10 MG tablet Take by mouth. 10/16/15 09/23/20  [provider]  ipratropium (ATROVENT) 0.06 % nasal spray Place 2 sprays into both nostrils 4 (four) times daily. Patient not taking: Reported on 04/29/2021 09/23/20   Margarette Canada, NP  nortriptyline [facility]) 10 MG capsule Take by mouth. 03/17/20 09/23/20  [provider]  nortriptyline (PAMELOR) 10 MG capsule Take by mouth.  09/16/20 09/16/21  [provider]    Family History Family History  Problem Relation Age of Onset   Breast cancer Maternal Aunt 70    Social History Social History   Tobacco Use   Smoking status: Never   Smokeless tobacco: Never  Vaping Use   Vaping Use: Never used  Substance Use Topics   Alcohol use: Yes   Drug use: Never     Allergies   Patient has no known allergies.   Review of Systems Review of Systems  Constitutional: Negative.   HENT:  Positive for rhinorrhea and sore throat. Negative for congestion, dental problem, drooling, ear discharge, ear pain, facial swelling, hearing loss, mouth sores, nosebleeds, postnasal drip, sinus pressure, sinus pain, sneezing, tinnitus, trouble swallowing and voice change.   Respiratory:  Positive for cough. Negative for apnea, choking, chest tightness, shortness of breath, wheezing and stridor.   Cardiovascular: Negative.   Gastrointestinal: Negative.      Physical Exam Triage Vital Signs ED Triage Vitals  Enc Vitals Group     BP 11/17/21 0823 127/84     Pulse Rate 11/17/21 0823 80     Resp 11/17/21 0823 18     Temp 11/17/21 0823 98.6 F (37 C)     Temp Source 11/17/21 0823 Oral     SpO2 11/17/21 0823 96 %     Weight 11/17/21 0821 134 lb (60.8 kg)     Height 11/17/21 0821 '5\' 5"'$  (1.651 m)     Head Circumference --  Peak Flow --      Pain Score 11/17/21 0821 8     Pain Loc --      Pain Edu? --      Excl. in [place]? --    No data found.  Updated Vital Signs BP 127/84 (BP Location: Left Arm)   Pulse 80   Temp 98.6 F (37 C) (Oral)   Resp 18   Ht '5\' 5"'$  (1.651 m)   Wt 134 lb (60.8 kg)   SpO2 96%   BMI 22.30 kg/m   Visual Acuity Right Eye Distance:   Left Eye Distance:   Bilateral Distance:    Right Eye Near:   Left Eye Near:    Bilateral Near:     Physical Exam Constitutional:      Appearance: She is well-developed.  HENT:     Head: Normocephalic.     Right Ear: Tympanic membrane and ear  canal normal.     Left Ear: Tympanic membrane and ear canal normal.     Nose: Congestion present. No rhinorrhea.     Mouth/Throat:     Mouth: Mucous membranes are moist.     Pharynx: Posterior oropharyngeal erythema present.     Tonsils: No tonsillar exudate. 0 on the right. 0 on the left.  Cardiovascular:     Rate and Rhythm: Normal rate and regular rhythm.     Heart sounds: Normal heart sounds.  Pulmonary:     Effort: Pulmonary effort is normal.     Breath sounds: Normal breath sounds.  Musculoskeletal:     Cervical back: Normal range of motion and neck supple.  Skin:    General: Skin is warm and dry.  Neurological:     General: No focal deficit present.     Mental Status: She is alert and oriented to person, place, and time.  Psychiatric:        Mood and Affect: Mood normal.        Behavior: Behavior normal.      UC Treatments / Results  Labs (all labs ordered are listed, but only abnormal results are displayed) Labs Reviewed  GROUP A STREP BY PCR    EKG   Radiology No results found.  Procedures Procedures (including critical care time)  Medications Ordered in UC Medications - No data to display  Initial Impression / Assessment and Plan / UC Course  I have reviewed the triage vital signs and the nursing notes.  Pertinent labs & imaging results that were available during my care of the patient were reviewed by me and considered in my medical decision making (see chart for details).  Strep pharyngitis  Confirmed by PCR, discussed findings with patient, amoxicillin 10-day course prescribed, recommended continued supportive measures for additional treatment, may follow-up with urgent care if symptoms persist or worsen Final Clinical Impressions(s) / UC Diagnoses   Final diagnoses:  None   Discharge Instructions   None    ED Prescriptions   None    PDMP not reviewed this encounter.   Hans Eden, [place] 11/17/21 [phone]

## 2022-01-04 ENCOUNTER — Other Ambulatory Visit: Payer: Self-pay | Admitting: Internal Medicine

## 2022-01-04 DIAGNOSIS — Z1231 Encounter for screening mammogram for malignant neoplasm of breast: Secondary | ICD-10-CM

## 2022-02-09 ENCOUNTER — Ambulatory Visit
Admission: RE | Admit: 2022-02-09 | Discharge: 2022-02-09 | Disposition: A | Discharge status: Home / Self Care | Payer: Medicare HMO | Source: Ambulatory Visit | Attending: Internal Medicine | Admitting: Internal Medicine

## 2022-02-09 DIAGNOSIS — Z1231 Encounter for screening mammogram for malignant neoplasm of breast: Secondary | ICD-10-CM | POA: Insufficient documentation

## 2022-03-03 ENCOUNTER — Ambulatory Visit (INDEPENDENT_AMBULATORY_CARE_PROVIDER_SITE_OTHER): Payer: Medicare HMO | Admitting: Dermatology

## 2022-03-03 DIAGNOSIS — L814 Other melanin hyperpigmentation: Secondary | ICD-10-CM

## 2022-03-03 DIAGNOSIS — L578 Other skin changes due to chronic exposure to nonionizing radiation: Secondary | ICD-10-CM

## 2022-03-03 DIAGNOSIS — L820 Inflamed seborrheic keratosis: Secondary | ICD-10-CM | POA: Diagnosis not present

## 2022-03-03 MED ORDER — VALACYCLOVIR HCL 500 MG PO TABS: 500.0000 mg | ORAL_TABLET | Freq: Two times a day (BID) | ORAL | 11 refills | Status: AC

## 2022-03-03 NOTE — Progress Notes (Signed)
   Follow-Up Visit   Subjective  Courtney Archer is a 76 y.o. female who presents for the following: Procedure (Actinic changes - BBL today). The patient has spots, moles and lesions to be evaluated, some may be new or changing and the patient has concerns as they are irritating.  The following portions of the chart were reviewed this encounter and updated as appropriate:   Tobacco  Allergies  Meds  Problems  Med Hx  Surg Hx  Fam Hx     Review of Systems:  No other skin or systemic complaints except as noted in HPI or Assessment and Plan.  Objective  Well appearing patient in no apparent distress; mood and affect are within normal limits.  A focused examination was performed including face. Relevant physical exam findings are noted in the Assessment and Plan.  Face Diffuse scaly erythematous macules with underlying dyspigmentation.              Face (15) Erythematous stuck-on, waxy papule or plaque   Assessment & Plan  Actinic skin damage  And Lentigos Face  Discussed the treatment option of BBL/laser.  Typically we recommend 1-3 treatment sessions about 5-8 weeks apart for best results.  The patient's condition may require "maintenance treatments" in the future.  The fee for BBL / laser treatments is $350 per treatment session for the whole face.  A fee can be quoted for other parts of the body. Insurance typically does not pay for BBL/laser treatments and therefore the fee is an out-of-pocket cost.   [place] BBL - 03/03/22 1300      Patient Details   Skin Type: III    Anesthestic Cream Applied: No    Photo Takes: Yes    Consent Signed: Yes      Treatment Details   Date: 03/03/22    Treatment #: 1    Filter: 1st Pass      1st Pass   Location: F    Device: 515    BBL j/cm2: 9    PW Msec Sec: 20    Cooling Temp: 22    Pulses: 174    Photorejuvenation - Face Prior to the procedure, the patient's past medical history, medications, allergies,  and the rare but potential risks and complications were reviewed with the patient and a signed consent was obtained.  Pre and post treatment care was discussed and instructions provided.  Patient tolerated the procedure well.   Nancy Fetter avoidance was stressed. The patient will call with any problems, questions or concerns prior to their next appointment.  valACYclovir (VALTREX) 500 MG tablet - Face Take 1 tablet (500 mg total) by mouth 2 (two) times daily. Start 1 day prior or day of procedure to reduce risk of fever blisters  Inflamed seborrheic keratosis (15) Face Symptomatic, irritating, patient would like treated. Destruction of lesion - Face Complexity: simple   Destruction method: cryotherapy   Informed consent: discussed and consent obtained   Timeout:  patient name, date of birth, surgical site, and procedure verified Lesion destroyed using liquid nitrogen: Yes   Region frozen until ice ball extended beyond lesion: Yes   Outcome: patient tolerated procedure well with no complications   Post-procedure details: wound care instructions given    Return for Follow up as scheduled.  I, Ashok Cordia, CMA, am acting as scribe for Sarina Ser, MD . Documentation: I have reviewed the above documentation for accuracy and completeness, and I agree with the above.  Sarina Ser, MD

## 2022-03-03 NOTE — Patient Instructions (Signed)
Cryotherapy Aftercare  Wash gently with soap and water everyday.   Apply Vaseline and Band-Aid daily until healed.   Discussed the treatment option of BBL/laser.  Typically we recommend 1-3 treatment sessions about 5-8 weeks apart for best results.  The patient's condition may require "maintenance treatments" in the future.  The fee for BBL / laser treatments is $350 per treatment session for the whole face.  A fee can be quoted for other parts of the body. Insurance typically does not pay for BBL/laser treatments and therefore the fee is an out-of-pocket cost.    Due to recent changes in healthcare laws, you may see results of your pathology and/or laboratory studies on MyChart before the doctors have had a chance to review them. We understand that in some cases there may be results that are confusing or concerning to you. Please understand that not all results are received at the same time and often the doctors may need to interpret multiple results in order to provide you with the best plan of care or course of treatment. Therefore, we ask that you please give [place] 2 business days to thoroughly review all your results before contacting the office for clarification. Should we see a critical lab result, you will be contacted sooner.   If You Need Anything After Your Visit  If you have any questions or concerns for your doctor, please call our main line at [phone] and press option 4 to reach your doctor's medical assistant. If no one answers, please leave a voicemail as directed and we will return your call as soon as possible. Messages left after 4 pm will be answered the following business day.   You may also send [place] a message via [place]. We typically respond to MyChart messages within 1-2 business days.  For prescription refills, please ask your pharmacy to contact our office. Our fax number is [phone].  If you have an urgent issue when the clinic is closed that cannot wait until the  next business day, you can page your doctor at the number below.    Please note that while we do our best to be available for urgent issues outside of office hours, we are not available 24/7.   If you have an urgent issue and are unable to reach [place], you may choose to seek medical care at your doctor's office, retail clinic, urgent care center, or emergency room.  If you have a medical emergency, please immediately call 911 or go to the emergency department.  Pager Numbers  - Dr. Nehemiah Massed: [phone]  - Dr. Laurence Ferrari: [phone]  - Dr. Nicole Kindred: [phone]  In the event of inclement weather, please call our main line at [phone] for an update on the status of any delays or closures.  Dermatology Medication Tips: Please keep the boxes that topical medications come in in order to help keep track of the instructions about where and how to use these. Pharmacies typically print the medication instructions only on the boxes and not directly on the medication tubes.   If your medication is too expensive, please contact our office at [phone] option 4 or send [place] a message through [place].   We are unable to tell what your co-pay for medications will be in advance as this is different depending on your insurance coverage. However, we may be able to find a substitute medication at lower cost or fill out paperwork to get insurance to cover a needed medication.   If a prior authorization is  required to get your medication covered by your insurance company, please allow [place] 1-2 business days to complete this process.  Drug prices often vary depending on where the prescription is filled and some pharmacies may offer cheaper prices.  The website www.goodrx.com contains coupons for medications through different pharmacies. The prices here do not account for what the cost may be with help from insurance (it may be cheaper with your insurance), but the website can give you the price if you did not  use any insurance.  - You can print the associated coupon and take it with your prescription to the pharmacy.  - You may also stop by our office during regular business hours and pick up a GoodRx coupon card.  - If you need your prescription sent electronically to a different pharmacy, notify our office through [facility] or by phone at [phone] option 4.     Si Usted Necesita Algo Despus de Su Visita  Tambin puede enviarnos un mensaje a travs de Pharmacist, community. Por lo general respondemos a los mensajes de MyChart en el transcurso de 1 a 2 das hbiles.  Para renovar recetas, por favor pida a su farmacia que se ponga en contacto con nuestra oficina. Harland Dingwall de fax es [place] [phone].  Si tiene un asunto urgente cuando la clnica est cerrada y que no puede esperar hasta el siguiente da hbil, puede llamar/localizar a su doctor(a) al nmero que aparece a continuacin.   Por favor, tenga en cuenta que aunque hacemos todo lo posible para estar disponibles para asuntos urgentes fuera del horario de [place], no estamos disponibles las 24 horas del da, los 7 das de la [place].   Si tiene un problema urgente y no puede comunicarse con nosotros, puede optar por buscar atencin mdica  en el consultorio de su doctor(a), en una clnica privada, en un centro de atencin urgente o en una sala de emergencias.  Si tiene Engineering geologist, por favor llame inmediatamente al 911 o vaya a la sala de emergencias.  Nmeros de bper  - Dr. Nehemiah Massed: [phone]  - Dra. Moye: [phone]  - Dra. Nicole Kindred: [phone]  En caso de inclemencias del [place], por favor llame a Johnsie Kindred principal al [phone] para una actualizacin sobre el [place] de cualquier retraso o cierre.  Consejos para la medicacin en dermatologa: Por favor, guarde las cajas en las que vienen los medicamentos de uso tpico para ayudarle a seguir las instrucciones sobre dnde y cmo usarlos. Las farmacias  generalmente imprimen las instrucciones del medicamento slo en las cajas y no directamente en los tubos del [place].   Si su medicamento es muy caro, por favor, pngase en contacto con Zigmund Daniel llamando al [phone] y presione la opcin 4 o envenos un mensaje a travs de Pharmacist, community.   No podemos decirle cul ser su copago por los medicamentos por adelantado ya que esto es diferente dependiendo de la cobertura de su seguro. Sin embargo, es posible que podamos encontrar un medicamento sustituto a Electrical engineer un formulario para que el seguro cubra el medicamento que se considera necesario.   Si se requiere una autorizacin previa para que su compaa de seguros [place] su medicamento, por favor permtanos de 1 a 2 das hbiles para completar este proceso.  Los precios de los medicamentos varan con frecuencia dependiendo del Environmental consultant de dnde se surte la receta y alguna farmacias pueden ofrecer precios ms baratos.  El sitio web www.goodrx.com tiene cupones para medicamentos de  diferentes farmacias. Los precios aqu no tienen en cuenta lo que podra costar con la ayuda del seguro (puede ser ms barato con su seguro), pero el sitio web puede darle el precio si no utiliz Research scientist (physical sciences).  - Puede imprimir el cupn correspondiente y llevarlo con su receta a la farmacia.  - Tambin puede pasar por nuestra oficina durante el horario de atencin regular y Charity fundraiser una tarjeta de cupones de GoodRx.  - Si necesita que su receta se enve electrnicamente a una farmacia diferente, informe a nuestra oficina a travs de MyChart de [place] o por telfono llamando al [phone] y presione la opcin 4.

## 2022-03-14 ENCOUNTER — Encounter: Payer: Self-pay | Admitting: Dermatology

## 2022-04-15 ENCOUNTER — Ambulatory Visit: Payer: Medicare HMO | Admitting: Dermatology

## 2022-05-05 ENCOUNTER — Ambulatory Visit: Payer: Medicare HMO | Admitting: Dermatology

## 2022-05-05 ENCOUNTER — Encounter: Payer: Self-pay | Admitting: Dermatology

## 2022-05-05 VITALS — BP 160/86

## 2022-05-05 DIAGNOSIS — L814 Other melanin hyperpigmentation: Secondary | ICD-10-CM

## 2022-05-05 DIAGNOSIS — L578 Other skin changes due to chronic exposure to nonionizing radiation: Secondary | ICD-10-CM | POA: Diagnosis not present

## 2022-05-05 DIAGNOSIS — Z1283 Encounter for screening for malignant neoplasm of skin: Secondary | ICD-10-CM | POA: Diagnosis not present

## 2022-05-05 DIAGNOSIS — L720 Epidermal cyst: Secondary | ICD-10-CM

## 2022-05-05 DIAGNOSIS — B078 Other viral warts: Secondary | ICD-10-CM | POA: Diagnosis not present

## 2022-05-05 DIAGNOSIS — D229 Melanocytic nevi, unspecified: Secondary | ICD-10-CM

## 2022-05-05 DIAGNOSIS — L820 Inflamed seborrheic keratosis: Secondary | ICD-10-CM

## 2022-05-05 DIAGNOSIS — L821 Other seborrheic keratosis: Secondary | ICD-10-CM

## 2022-05-05 NOTE — Patient Instructions (Addendum)
Cryotherapy Aftercare  Wash gently with soap and water everyday.   Apply Vaseline and Band-Aid daily until healed.     Due to recent changes in healthcare laws, you may see results of your pathology and/or laboratory studies on MyChart before the doctors have had a chance to review them. We understand that in some cases there may be results that are confusing or concerning to you. Please understand that not all results are received at the same time and often the doctors may need to interpret multiple results in order to provide you with the best plan of care or course of treatment. Therefore, we ask that you please give us 2 business days to thoroughly review all your results before contacting the office for clarification. Should we see a critical lab result, you will be contacted sooner.   If You Need Anything After Your Visit  If you have any questions or concerns for your doctor, please call our main line at [phone] and press option 4 to reach your doctor's medical assistant. If no one answers, please leave a voicemail as directed and we will return your call as soon as possible. Messages left after 4 pm will be answered the following business day.   You may also send us a message via MyChart. We typically respond to MyChart messages within 1-2 business days.  For prescription refills, please ask your pharmacy to contact our office. Our fax number is [phone].  If you have an urgent issue when the clinic is closed that cannot wait until the next business day, you can page your doctor at the number below.    Please note that while we do our best to be available for urgent issues outside of office hours, we are not available 24/7.   If you have an urgent issue and are unable to reach us, you may choose to seek medical care at your doctor's office, retail clinic, urgent care center, or emergency room.  If you have a medical emergency, please immediately call 911 or go to the  emergency department.  Pager Numbers  - Dr. Kowalski: [phone]  - Dr. Moye: [phone]  - Dr. Stewart: [phone]  In the event of inclement weather, please call our main line at [phone] for an update on the status of any delays or closures.  Dermatology Medication Tips: Please keep the boxes that topical medications come in in order to help keep track of the instructions about where and how to use these. Pharmacies typically print the medication instructions only on the boxes and not directly on the medication tubes.   If your medication is too expensive, please contact our office at [phone] option 4 or send us a message through MyChart.   We are unable to tell what your co-pay for medications will be in advance as this is different depending on your insurance coverage. However, we may be able to find a substitute medication at lower cost or fill out paperwork to get insurance to cover a needed medication.   If a prior authorization is required to get your medication covered by your insurance company, please allow us 1-2 business days to complete this process.  Drug prices often vary depending on where the prescription is filled and some pharmacies may offer cheaper prices.  The website www.goodrx.com contains coupons for medications through different pharmacies. The prices here do not account for what the cost may be with help from insurance (it may be cheaper with your insurance), but the website can   give you the price if you did not use any insurance.  - You can print the associated coupon and take it with your prescription to the pharmacy.  - You may also stop by our office during regular business hours and pick up a GoodRx coupon card.  - If you need your prescription sent electronically to a different pharmacy, notify our office through [place] MyChart or by phone at [phone] option 4.     Si Usted Necesita Algo Despus de Su Visita  Tambin puede  enviarnos un mensaje a travs de MyChart. Por lo general respondemos a los mensajes de MyChart en el transcurso de 1 a 2 das hbiles.  Para renovar recetas, por favor pida a su farmacia que se ponga en contacto con nuestra oficina. Nuestro nmero de fax es el [phone].  Si tiene un asunto urgente cuando la clnica est cerrada y que no puede esperar hasta el siguiente da hbil, puede llamar/localizar a su doctor(a) al nmero que aparece a continuacin.   Por favor, tenga en cuenta que aunque hacemos todo lo posible para estar disponibles para asuntos urgentes fuera del horario de oficina, no estamos disponibles las 24 horas del da, los 7 das de la semana.   Si tiene un problema urgente y no puede comunicarse con nosotros, puede optar por buscar atencin mdica  en el consultorio de su doctor(a), en una clnica privada, en un centro de atencin urgente o en una sala de emergencias.  Si tiene una emergencia mdica, por favor llame inmediatamente al 911 o vaya a la sala de emergencias.  Nmeros de bper  - Dr. Kowalski: [phone]  - Dra. Moye: [phone]  - Dra. Stewart: [phone]  En caso de inclemencias del tiempo, por favor llame a nuestra lnea principal al [phone] para una actualizacin sobre el estado de cualquier retraso o cierre.  Consejos para la medicacin en dermatologa: Por favor, guarde las cajas en las que vienen los medicamentos de uso tpico para ayudarle a seguir las instrucciones sobre dnde y cmo usarlos. Las farmacias generalmente imprimen las instrucciones del medicamento slo en las cajas y no directamente en los tubos del medicamento.   Si su medicamento es muy caro, por favor, pngase en contacto con nuestra oficina llamando al [phone] y presione la opcin 4 o envenos un mensaje a travs de MyChart.   No podemos decirle cul ser su copago por los medicamentos por adelantado ya que esto es diferente dependiendo de la cobertura de su seguro.  Sin embargo, es posible que podamos encontrar un medicamento sustituto a menor costo o llenar un formulario para que el seguro cubra el medicamento que se considera necesario.   Si se requiere una autorizacin previa para que su compaa de seguros cubra su medicamento, por favor permtanos de 1 a 2 das hbiles para completar este proceso.  Los precios de los medicamentos varan con frecuencia dependiendo del lugar de dnde se surte la receta y alguna farmacias pueden ofrecer precios ms baratos.  El sitio web www.goodrx.com tiene cupones para medicamentos de diferentes farmacias. Los precios aqu no tienen en cuenta lo que podra costar con la ayuda del seguro (puede ser ms barato con su seguro), pero el sitio web puede darle el precio si no utiliz ningn seguro.  - Puede imprimir el cupn correspondiente y llevarlo con su receta a la farmacia.  - Tambin puede pasar por nuestra oficina durante el horario de atencin regular y recoger una tarjeta de cupones de GoodRx.  -   Si necesita que su receta se enve electrnicamente a una farmacia diferente, informe a nuestra oficina a travs de MyChart de [place] o por telfono llamando al [phone] y presione la opcin 4.  

## 2022-05-05 NOTE — Progress Notes (Signed)
Follow-Up Visit   Subjective  Courtney Archer is a 77 y.o. female who presents for the following: Annual Exam (History of dysplastic nevus - The patient presents for Total-Body Skin Exam (TBSE) for skin cancer screening and mole check.  The patient has spots, moles and lesions to be evaluated, some may be new or changing and the patient has concerns that these could be cancer./).  The following portions of the chart were reviewed this encounter and updated as appropriate:   Tobacco  Allergies  Meds  Problems  Med Hx  Surg Hx  Fam Hx     Review of Systems:  No other skin or systemic complaints except as noted in HPI or Assessment and Plan.  Objective  Well appearing patient in no apparent distress; mood and affect are within normal limits.  A full examination was performed including scalp, head, eyes, ears, nose, lips, neck, chest, axillae, abdomen, back, buttocks, bilateral upper extremities, bilateral lower extremities, hands, feet, fingers, toes, fingernails, and toenails. All findings within normal limits unless otherwise noted below.  Left Root of Nose Smooth white papule(s).   Right post shoulder (16) Erythematous stuck-on, waxy papule or plaque  Right Mid Palm Verrucous papules -- Discussed viral etiology and contagion.    Assessment & Plan   Lentigines - Scattered tan macules - Due to sun exposure - Benign-appearing, observe - Recommend daily broad spectrum sunscreen SPF 30+ to sun-exposed areas, reapply every 2 hours as needed. - Call for any changes  Seborrheic Keratoses - Stuck-on, waxy, tan-brown papules and/or plaques  - Benign-appearing - Discussed benign etiology and prognosis. - Observe - Call for any changes  Melanocytic Nevi - Tan-brown and/or pink-flesh-colored symmetric macules and papules - Benign appearing on exam today - Observation - Call clinic for new or changing moles - Recommend daily use of broad spectrum spf 30+ sunscreen  to sun-exposed areas.   Hemangiomas - Red papules - Discussed benign nature - Observe - Call for any changes  Actinic Damage - Chronic condition, secondary to cumulative UV/sun exposure - diffuse scaly erythematous macules with underlying dyspigmentation - Recommend daily broad spectrum sunscreen SPF 30+ to sun-exposed areas, reapply every 2 hours as needed.  - Staying in the shade or wearing long sleeves, sun glasses (UVA+UVB protection) and wide brim hats (4-inch brim around the entire circumference of the hat) are also recommended for sun protection.  - Call for new or changing lesions.  Skin cancer screening performed today.  [place] Left Root of Nose Benign-appearing.  Observation.  Call clinic for new or changing lesions.  Recommend daily use of broad spectrum spf 30+ sunscreen to sun-exposed areas.  May use topical Retinoid.  Inflamed seborrheic keratosis (16) Right post shoulder Destruction of lesion - Right post shoulder Complexity: simple   Destruction method: cryotherapy   Informed consent: discussed and consent obtained   Timeout:  patient name, date of birth, surgical site, and procedure verified Lesion destroyed using liquid nitrogen: Yes   Region frozen until ice ball extended beyond lesion: Yes   Outcome: patient tolerated procedure well with no complications   Post-procedure details: wound care instructions given    Other viral warts Right Mid Palm Destruction of lesion - Right Mid Palm Complexity: simple   Destruction method: cryotherapy   Informed consent: discussed and consent obtained   Timeout:  patient name, date of birth, surgical site, and procedure verified Lesion destroyed using liquid nitrogen: Yes   Region frozen until ice ball extended beyond lesion:  Yes   Outcome: patient tolerated procedure well with no complications   Post-procedure details: wound care instructions given    Return in about 1 year (around 05/06/2023) for TBSE.  I, Ashok Cordia, CMA, am acting as scribe for Sarina Ser, MD . Documentation: I have reviewed the above documentation for accuracy and completeness, and I agree with the above.  Sarina Ser, MD

## 2022-05-10 ENCOUNTER — Encounter: Payer: Self-pay | Admitting: Dermatology

## 2022-08-26 ENCOUNTER — Ambulatory Visit: Payer: Medicare HMO | Admitting: Dermatology

## 2022-12-29 ENCOUNTER — Other Ambulatory Visit: Payer: Self-pay | Admitting: Internal Medicine

## 2022-12-29 DIAGNOSIS — Z1231 Encounter for screening mammogram for malignant neoplasm of breast: Secondary | ICD-10-CM

## 2023-02-03 ENCOUNTER — Ambulatory Visit: Payer: Medicare HMO | Admitting: Dermatology

## 2023-02-10 ENCOUNTER — Other Ambulatory Visit: Payer: Self-pay | Admitting: Internal Medicine

## 2023-02-10 DIAGNOSIS — I7090 Unspecified atherosclerosis: Secondary | ICD-10-CM

## 2023-02-15 ENCOUNTER — Ambulatory Visit
Admission: RE | Admit: 2023-02-15 | Discharge: 2023-02-15 | Disposition: A | Discharge status: Home / Self Care | Payer: Medicare HMO | Source: Ambulatory Visit | Attending: Internal Medicine | Admitting: Internal Medicine

## 2023-02-15 DIAGNOSIS — Z1231 Encounter for screening mammogram for malignant neoplasm of breast: Secondary | ICD-10-CM | POA: Diagnosis present

## 2023-02-15 DIAGNOSIS — I7090 Unspecified atherosclerosis: Secondary | ICD-10-CM | POA: Insufficient documentation

## 2023-05-11 ENCOUNTER — Ambulatory Visit: Payer: Medicare HMO | Admitting: Dermatology

## 2023-05-12 ENCOUNTER — Ambulatory Visit: Payer: Medicare HMO | Admitting: Dermatology

## 2023-05-17 ENCOUNTER — Encounter: Payer: Self-pay | Admitting: Dermatology

## 2023-05-17 ENCOUNTER — Ambulatory Visit: Payer: Medicare HMO | Admitting: Dermatology

## 2023-05-17 DIAGNOSIS — L821 Other seborrheic keratosis: Secondary | ICD-10-CM

## 2023-05-17 DIAGNOSIS — L814 Other melanin hyperpigmentation: Secondary | ICD-10-CM

## 2023-05-17 DIAGNOSIS — L578 Other skin changes due to chronic exposure to nonionizing radiation: Secondary | ICD-10-CM

## 2023-05-17 DIAGNOSIS — L570 Actinic keratosis: Secondary | ICD-10-CM

## 2023-05-17 DIAGNOSIS — Z86018 Personal history of other benign neoplasm: Secondary | ICD-10-CM

## 2023-05-17 DIAGNOSIS — Z1283 Encounter for screening for malignant neoplasm of skin: Secondary | ICD-10-CM | POA: Diagnosis not present

## 2023-05-17 DIAGNOSIS — D1801 Hemangioma of skin and subcutaneous tissue: Secondary | ICD-10-CM

## 2023-05-17 DIAGNOSIS — W908XXA Exposure to other nonionizing radiation, initial encounter: Secondary | ICD-10-CM | POA: Diagnosis not present

## 2023-05-17 DIAGNOSIS — I781 Nevus, non-neoplastic: Secondary | ICD-10-CM

## 2023-05-17 DIAGNOSIS — D692 Other nonthrombocytopenic purpura: Secondary | ICD-10-CM

## 2023-05-17 DIAGNOSIS — D229 Melanocytic nevi, unspecified: Secondary | ICD-10-CM

## 2023-05-17 NOTE — Patient Instructions (Addendum)
[facility] [address] 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 I don't see a phone number for them but you can send them an email via their website.   Cryotherapy Aftercare  Wash gently with soap and water everyday.   Apply Vaseline and Band-Aid daily until healed.    Seborrheic Keratosis  What causes seborrheic keratoses? Seborrheic keratoses are harmless, common skin growths that first appear during adult life.  As time goes by, more growths appear.  Some people may develop a large number of them.  Seborrheic keratoses appear on both covered and uncovered body parts.  They are not caused by sunlight.  The tendency to develop seborrheic keratoses can be inherited.  They vary in color from skin-colored to gray, brown, or even black.  They can be either smooth or have a rough, warty surface.   Seborrheic keratoses are superficial and look as if they were stuck on the skin.  Under the microscope this type of keratosis looks like layers upon layers of skin.  That is why at times the top layer may seem to fall off, but the rest of the growth remains and re-grows.    Treatment Seborrheic keratoses do not need to be treated, but can easily be removed in the office.  Seborrheic keratoses often cause symptoms when they rub on clothing or jewelry.  Lesions can be in the way of shaving.  If they become inflamed, they can cause itching, soreness, or burning.  Removal of a seborrheic keratosis can be accomplished by freezing, burning, or surgery. If any spot bleeds, scabs, or grows rapidly, please return to have it checked, as these can be an indication of a skin cancer.  Melanoma ABCDEs  Melanoma is the most dangerous type of skin cancer, and is the leading cause of death from skin disease.  You are more likely to develop melanoma if you: Have light-colored skin, light-colored eyes, or red or blond hair Spend a lot of time in the sun Tan regularly, either  outdoors or in a tanning bed Have had blistering sunburns, especially during childhood Have a close family member who has had a melanoma Have atypical moles or large birthmarks  Early detection of melanoma is key since treatment is typically straightforward and cure rates are extremely high if we catch it early.   The first sign of melanoma is often a change in a mole or a new dark spot.  The ABCDE system is a way of remembering the signs of melanoma.  A for asymmetry:  The two halves do not match. B for border:  The edges of the growth are irregular. C for color:  A mixture of colors are present instead of an even brown color. D for diameter:  Melanomas are usually (but not always) greater than 6mm - the size of a pencil eraser. E for evolution:  The spot keeps changing in size, shape, and color.  Please check your skin once per month between visits. You can use a small mirror in front and a large mirror behind you to keep an eye on the back side or your body.   If you see any new or changing lesions before your next follow-up, please call to schedule a visit.  Please continue daily skin protection including broad spectrum sunscreen SPF 30+ to sun-exposed areas, reapplying every 2 hours as needed when you're outdoors.    Due to recent changes in healthcare laws, you  may see results of your pathology and/or laboratory studies on MyChart before the doctors have had a chance to review them. We understand that in some cases there may be results that are confusing or concerning to you. Please understand that not all results are received at the same time and often the doctors may need to interpret multiple results in order to provide you with the best plan of care or course of treatment. Therefore, we ask that you please give [place] 2 business days to thoroughly review all your results before contacting the office for clarification. Should we see a critical lab result, you will be contacted  sooner.   If You Need Anything After Your Visit  If you have any questions or concerns for your doctor, please call our main line at [phone] and press option 4 to reach your doctor's medical assistant. If no one answers, please leave a voicemail as directed and we will return your call as soon as possible. Messages left after 4 pm will be answered the following business day.   You may also send [place] a message via MyChart. We typically respond to MyChart messages within 1-2 business days.  For prescription refills, please ask your pharmacy to contact our office. Our fax number is [phone].  If you have an urgent issue when the clinic is closed that cannot wait until the next business day, you can page your doctor at the number below.    Please note that while we do our best to be available for urgent issues outside of office hours, we are not available 24/7.   If you have an urgent issue and are unable to reach [place], you may choose to seek medical care at your doctor's office, retail clinic, urgent care center, or emergency room.  If you have a medical emergency, please immediately call 911 or go to the emergency department.  Pager Numbers  - Dr. Gwen Pounds: [phone]  - Dr. Roseanne Reno: [phone]  - Dr. Katrinka Blazing: [phone]   In the event of inclement weather, please call our main line at [phone] for an update on the status of any delays or closures.  Dermatology Medication Tips: Please keep the boxes that topical medications come in in order to help keep track of the instructions about where and how to use these. Pharmacies typically print the medication instructions only on the boxes and not directly on the medication tubes.   If your medication is too expensive, please contact our office at [phone] option 4 or send [place] a message through MyChart.   We are unable to tell what your co-pay for medications will be in advance as this is different depending on your  insurance coverage. However, we may be able to find a substitute medication at lower cost or fill out paperwork to get insurance to cover a needed medication.   If a prior authorization is required to get your medication covered by your insurance company, please allow [place] 1-2 business days to complete this process.  Drug prices often vary depending on where the prescription is filled and some pharmacies may offer cheaper prices.  The website www.goodrx.com contains coupons for medications through different pharmacies. The prices here do not account for what the cost may be with help from insurance (it may be cheaper with your insurance), but the website can give you the price if you did not use any insurance.  - You can print the associated coupon and take it with your prescription to the pharmacy.  -  You may also stop by our office during regular business hours and pick up a GoodRx coupon card.  - If you need your prescription sent electronically to a different pharmacy, notify our office through [facility] or by phone at [phone] option 4.     Si Usted Necesita Algo Despus de Su Visita  Tambin puede enviarnos un mensaje a travs de Clinical cytogeneticist. Por lo general respondemos a los mensajes de MyChart en el transcurso de 1 a 2 das hbiles.  Para renovar recetas, por favor pida a su farmacia que se ponga en contacto con nuestra oficina. Annie Sable de fax es [place] [phone].  Si tiene un asunto urgente cuando la clnica est cerrada y que no puede esperar hasta el siguiente da hbil, puede llamar/localizar a su doctor(a) al nmero que aparece a continuacin.   Por favor, tenga en cuenta que aunque hacemos todo lo posible para estar disponibles para asuntos urgentes fuera del horario de [place], no estamos disponibles las 24 horas del da, los 7 [address]  [address] de la [place].   Si tiene un problema urgente y no puede comunicarse con nosotros, puede optar por buscar atencin mdica  en el  consultorio de su doctor(a), en una clnica privada, en un centro de atencin urgente o en una sala de emergencias.  Si tiene Engineer, drilling, por favor llame inmediatamente al 911 o vaya a la sala de emergencias.  Nmeros de bper  - Dr. Gwen Pounds: [phone]  - Dra. Roseanne Reno: [phone]  - Dr. Katrinka Blazing: [phone]   En caso de inclemencias del tiempo, por favor llame a Lacy Duverney principal al [phone] para una actualizacin sobre el [place] de cualquier retraso o cierre.  Consejos para la medicacin en dermatologa: Por favor, guarde las cajas en las que vienen los medicamentos de uso tpico para ayudarle a seguir las instrucciones sobre dnde y cmo usarlos. Las farmacias generalmente imprimen las instrucciones del medicamento slo en las cajas y no directamente en los tubos del [place].   Si su medicamento es muy caro, por favor, pngase en contacto con Rolm Gala llamando al [phone] y presione la opcin 4 o envenos un mensaje a travs de Clinical cytogeneticist.   No podemos decirle cul ser su copago por los medicamentos por adelantado ya que esto es diferente dependiendo de la cobertura de su seguro. Sin embargo, es posible que podamos encontrar un medicamento sustituto a Audiological scientist un formulario para que el seguro cubra el medicamento que se considera necesario.   Si se requiere una autorizacin previa para que su compaa de seguros [place] su medicamento, por favor permtanos de 1 a 2 das hbiles para completar [address]  Los precios de los medicamentos varan con frecuencia dependiendo del Environmental consultant de dnde se surte la receta y alguna farmacias pueden ofrecer precios ms baratos.  El sitio web www.goodrx.com tiene cupones para medicamentos de Health and safety inspector. Los precios aqu no tienen en cuenta lo que podra costar con la ayuda del seguro (puede ser ms barato con su seguro), pero el sitio web puede darle el precio si no utiliz Tourist information centre manager.  -  Puede imprimir el cupn correspondiente y llevarlo con su receta a la farmacia.  - Tambin puede pasar por nuestra oficina durante el horario de atencin regular y Education officer, museum una tarjeta de cupones de GoodRx.  - Si necesita que su receta se enve electrnicamente a Psychiatrist, informe a nuestra oficina a travs de MyChart de [place] o por telfono llamando al [phone]  y presione la opcin 4.

## 2023-05-17 NOTE — Progress Notes (Signed)
Follow-Up Visit   Subjective  Courtney Archer is a 78 y.o. female who presents for the following: Skin Cancer Screening and Full Body Skin Exam  The patient presents for Total-Body Skin Exam (TBSE) for skin cancer screening and mole check. The patient has spots, moles and lesions to be evaluated, some may be new or changing and the patient may have concern these could be cancer.  Pt with hx of atypical nevi.  The following portions of the chart were reviewed this encounter and updated as appropriate: medications, allergies, medical history  Review of Systems:  No other skin or systemic complaints except as noted in HPI or Assessment and Plan.  Objective  Well appearing patient in no apparent distress; mood and affect are within normal limits.  A full examination was performed including scalp, head, eyes, ears, nose, lips, neck, chest, axillae, abdomen, back, buttocks, bilateral upper extremities, bilateral lower extremities, hands, feet, fingers, toes, fingernails, and toenails. All findings within normal limits unless otherwise noted below.   Relevant physical exam findings are noted in the Assessment and Plan.  L Preauricular x 1 Erythematous thin papules/macules with gritty scale.   Assessment & Plan   SKIN CANCER SCREENING PERFORMED TODAY.  ACTINIC DAMAGE - Chronic condition, secondary to cumulative UV/sun exposure - diffuse scaly erythematous macules with underlying dyspigmentation - Recommend daily broad spectrum sunscreen SPF 30+ to sun-exposed areas, reapply every 2 hours as needed.  - Staying in the shade or wearing long sleeves, sun glasses (UVA+UVB protection) and wide brim hats (4-inch brim around the entire circumference of the hat) are also recommended for sun protection.  - Call for new or changing lesions.  LENTIGINES, SEBORRHEIC KERATOSES, HEMANGIOMAS - Benign normal skin lesions - Benign-appearing - Call for any changes  MELANOCYTIC NEVI -  Tan-brown and/or pink-flesh-colored symmetric macules and papules - Benign appearing on exam today - Observation - Call clinic for new or changing moles - Recommend daily use of broad spectrum spf 30+ sunscreen to sun-exposed areas.   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 2015 - Recommend regular full body skin exams - Recommend daily broad spectrum sunscreen SPF 30+ to sun-exposed areas, reapply every 2 hours as needed.  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  Treatable, but not curable. - Violaceous macules and patches - Benign - Related to trauma, age, sun damage and/or use of blood thinners, chronic use of topical and/or oral steroids - Observe - Can use OTC arnica containing moisturizer such as Dermend Bruise Formula if desired - Call for worsening or other concerns  AK (ACTINIC KERATOSIS) L Preauricular x 1 Actinic keratoses are precancerous spots that appear secondary to cumulative UV radiation exposure/sun exposure over time. They are chronic with expected duration over 1 year. A portion of actinic keratoses will progress to squamous cell carcinoma of the skin. It is not possible to reliably predict which spots will progress to skin cancer and so treatment is recommended to prevent development of skin cancer.  Recommend daily broad spectrum sunscreen SPF 30+ to sun-exposed areas, reapply every 2 hours as needed.  Recommend staying in the shade or wearing long sleeves, sun glasses (UVA+UVB protection) and wide brim hats (4-inch brim around the entire  circumference of the hat). Call for new or changing lesions. Destruction of lesion - L Preauricular x 1 Complexity:  simple   Destruction method: cryotherapy   Informed consent: discussed and consent obtained   Lesion destroyed using liquid nitrogen: Yes   Region frozen until ice ball extended beyond lesion: Yes   Outcome: patient tolerated procedure well with no complications   Post-procedure details: wound care instructions given   Prior to procedure, discussed risks of blister formation, small wound, skin dyspigmentation, or rare scar following cryotherapy. Recommend Vaseline ointment to treated areas while healing.   Return in about 1 year (around 05/16/2024) for TBSE, Hx Dysplastic Nevi, Hx AK.  Anise Salvo, RMA, am acting as scribe for Willeen Niece, MD .   Documentation: I have reviewed the above documentation for accuracy and completeness, and I agree with the above.  Willeen Niece, MD

## 2023-05-20 ENCOUNTER — Ambulatory Visit
Admission: EM | Admit: 2023-05-20 | Discharge: 2023-05-20 | Disposition: A | Discharge status: Home / Self Care | Payer: Medicare HMO | Attending: Family Medicine | Admitting: Family Medicine

## 2023-05-20 DIAGNOSIS — R21 Rash and other nonspecific skin eruption: Secondary | ICD-10-CM

## 2023-05-20 MED ORDER — TRIAMCINOLONE ACETONIDE 0.1 % EX OINT: 1.0000 | TOPICAL_OINTMENT | Freq: Two times a day (BID) | CUTANEOUS | 0 refills | Status: AC

## 2023-05-20 MED ORDER — PREDNISONE 10 MG (21) PO TBPK: ORAL_TABLET | Freq: Every day | ORAL | 0 refills | Status: AC

## 2023-05-20 NOTE — ED Triage Notes (Signed)
Rash started Wednesday night when she got out of the shower. Doesn't itch or hurt

## 2023-05-20 NOTE — Discharge Instructions (Addendum)
Your rash resembles a Pityriasis rosea like rash. See handout for more information.  To be sure this is not irritant or allergic dermatitis, we will trial steroid ointment and oral prednisone. Follow up with your dermatologist, if not improving.

## 2023-05-20 NOTE — ED Provider Notes (Signed)
[facility]    CSN: [id number] Arrival date & time: 05/20/23  0818      History   Chief Complaint Chief Complaint  Patient presents with   Rash    HPI Courtney Archer is a 78 y.o. female.   HPI  Courtney Archer presents for rash that started on Wednesday night.  She got out of the shower and had a red spots on her abdomen, back and thighs.  No itching or pain.  Has not put anything on the area.  She has not changed soaps, lotions or detergent. Uses Dove soap.  She had a dermatology appointment.      Past Medical History:  Diagnosis Date   Actinic keratosis    Atypical mole 09/04/2013   Right med. prox. thigh. Pigmented spindle cell nevus, moderately atypical, limited margins free.    There are no active problems to display for this patient.   No past surgical history on file.  OB History   No obstetric history on file.      Home Medications    Prior to Admission medications   Medication Sig Start Date End Date Taking? Authorizing Provider  predniSONE (STERAPRED UNI-PAK 21 TAB) 10 MG (21) TBPK tablet Take by mouth daily. Take 6 tabs by mouth daily for 1, then 5 tabs for 1 day, then 4 tabs for 1 day, then 3 tabs for 1 day, then 2 tabs for 1 day, then 1 tab for 1 day. 05/20/23  Yes Chaylee Ehrsam, Seward Meth, DO  sertraline (ZOLOFT) 50 MG tablet Take by mouth. 03/28/23  Yes [provider]  triamcinolone ointment (KENALOG) 0.1 % Apply 1 Application topically 2 (two) times daily. 05/20/23  Yes Milady Fleener, DO  aspirin 81 MG EC tablet Take by mouth.   Yes [provider]  atorvastatin (LIPITOR) 10 MG tablet Take by mouth. 10/16/15 05/20/23 Yes [provider]  Biotin 1000 MCG tablet Take by mouth.   Yes [provider]  Calcium Carb-Cholecalciferol (CALCIUM HIGH POTENCY/VITAMIN D) 600-5 MG-MCG TABS Take by mouth.   Yes [provider]  estradiol (ESTRACE) 0.1 MG/GM vaginal cream INSERT 4 CLICKS (= ) VAGINALLY AT BEDTIME  TWO TIMES PER WEEK -REFILLS 48HR NOTICE - CUSTOM COMPOUND 05/12/20   [provider]  gabapentin (NEURONTIN) 100 MG capsule Take 100 mg by mouth at bedtime. 04/24/21  Yes [provider]  ipratropium (ATROVENT) 0.06 % nasal spray Place 2 sprays into both nostrils 4 (four) times daily. Patient not taking: Reported on 04/29/2021 09/23/20   Becky Augusta, NP  nortriptyline [facility]) 10 MG capsule Take by mouth. 03/17/20 09/23/20  [provider]  nortriptyline (PAMELOR) 10 MG capsule Take by mouth. 09/16/20 09/16/21  [provider]  omeprazole (PRILOSEC) 20 MG capsule Take by mouth. 10/16/15   [provider]  valACYclovir (VALTREX) 500 MG tablet Take 1 tablet (500 mg total) by mouth 2 (two) times daily. Start 1 day prior or day of procedure to reduce risk of fever blisters 03/03/22   Deirdre Evener, MD    Family History Family History  Problem Relation Age of Onset   Breast cancer Maternal Aunt 38    Social History Social History   Tobacco Use   Smoking status: Never   Smokeless tobacco: Never  Vaping Use   Vaping status: Never Used  Substance Use Topics   Alcohol use: Yes   Drug use: Never     Allergies   Patient has no known allergies.   Review  of Systems Review of Systems :negative unless otherwise stated in HPI.      Physical Exam Triage Vital Signs ED Triage Vitals  Encounter Vitals Group     BP      Systolic BP Percentile      Diastolic BP Percentile      Pulse      Resp      Temp      Temp src      SpO2      Weight      Height      Head Circumference      Peak Flow      Pain Score      Pain Loc      Pain Education      Exclude from Growth Chart    No data found.  Updated Vital Signs BP 137/84 (BP Location: Right Arm)   Pulse 73   Temp 98.3 F (36.8 C) (Oral)   Resp 17   SpO2 97%   Visual Acuity Right Eye Distance:   Left Eye Distance:   Bilateral Distance:    Right Eye Near:   Left Eye Near:     Bilateral Near:     Physical Exam  GEN: alert, well appearing elderly female, in no acute distress  EYES:  no scleral injection CV: regular rate, brisk cap refill  RESP: no increased work of breathing SKIN: warm and dry; erythematous scaly papular rash on trunk  UC Treatments / Results  Labs (all labs ordered are listed, but only abnormal results are displayed) Labs Reviewed - No data to display  EKG   Radiology No results found.  Procedures Procedures (including critical care time)  Medications Ordered in UC Medications - No data to display  Initial Impression / Assessment and Plan / UC Course  I have reviewed the triage vital signs and the nursing notes.  Pertinent labs & imaging results that were available during my care of the patient were reviewed by me and considered in my medical decision making (see chart for details).     Patient is a 78 y.o. femalewho presents for rash.  Overall, patient is well-appearing and well-hydrated.  Vital signs stable.  [place]erta is afebrile.  Exam concerning for pityriasis rosea though can not rule out contact dermatitis.  Treat with oral prednisone and steroid ointment. No sign of infection to suggest antibiotics ro antifungals at this time.  Pt was just seen by her dermatologist the day before her rash began. Advised if symptoms do not improve, follow up with dermatologist.    Reviewed expectations regarding course of current medical issues.  All questions asked were answered.  Outlined signs and symptoms indicating need for more acute intervention. Patient verbalized understanding. After Visit Summary given.   Final Clinical Impressions(s) / UC Diagnoses   Final diagnoses:  Pityriasis rosea-like skin eruption  Rash     Discharge Instructions      Your rash resembles a Pityriasis rosea like rash. See handout for more information.  To be sure this is not irritant or allergic dermatitis, we will trial steroid ointment and oral  prednisone. Follow up with your dermatologist, if not improving.      ED Prescriptions     Medication Sig Dispense Auth. Provider   predniSONE (STERAPRED UNI-PAK 21 TAB) 10 MG (21) TBPK tablet Take by mouth daily. Take 6 tabs by mouth daily for 1, then 5 tabs for 1 day, then 4 tabs for 1 day, then 3 tabs  for 1 day, then 2 tabs for 1 day, then 1 tab for 1 day. 21 tablet Chatara Lucente, DO   triamcinolone ointment (KENALOG) 0.1 % Apply 1 Application topically 2 (two) times daily. 30 g Katha Cabal, DO      PDMP not reviewed this encounter.              Katha Cabal, DO 05/20/23 [phone]

## 2024-01-18 ENCOUNTER — Ambulatory Visit: Payer: Medicare HMO | Admitting: Dermatology

## 2024-04-26 NOTE — Care Plan (Signed)
" °  Problem: Risk for impaired skin integrity- (INTRAOP, POST OP) Goal: Pt free from sign/symptoms of impaired skin integrity. 04/26/2024 1444 by Sebastian Agent, RN Outcome: Not Met/ Adequate for Discharge 04/26/2024 [phone] by Sebastian Agent, RN Outcome: Progressing Goal: Pt free from signs/symptoms of injury r/t transfer/transport 04/26/2024 1444 by Sebastian Agent, RN Outcome: Not Met/ Adequate for Discharge 04/26/2024 0851 by Sebastian Agent, RN Outcome: Progressing   Problem: Anxiety/Fear r/t surgical/procedural event (PREOP, INTRAOP) Goal: Pt participates in decisions affecting Plan of Care. 04/26/2024 1444 by Sebastian Agent, RN Outcome: Not Met/ Adequate for Discharge 04/26/2024 [phone] by Sebastian Agent, RN Outcome: Progressing Goal: Pts care is consistent with the individualized periop POC. 04/26/2024 1444 by Sebastian Agent, RN Outcome: Not Met/ Adequate for Discharge 04/26/2024 0851 by Sebastian Agent, RN Outcome: Progressing   Problem: Risk for acute pain  (PREOP, POST OP) Goal: The patient demonstrates knowledge of pain management 04/26/2024 1444 by Sebastian Agent, RN Outcome: Not Met/ Adequate for Discharge 04/26/2024 0851 by Sebastian Agent, RN Outcome: Progressing Goal: Pt demonstrates adequate pain control in periop period. 04/26/2024 1444 by Sebastian Agent, RN Outcome: Not Met/ Adequate for Discharge 04/26/2024 [phone] by Sebastian Agent, RN Outcome: Progressing   Problem: All patients positive for delirium Goal: Patient will remain safe 04/26/2024 1444 by Sebastian Agent, RN Outcome: Not Met/ Adequate for Discharge 04/26/2024 0851 by Sebastian Agent, RN Outcome: Progressing   Problem: Disorientation Goal: The patient will be oriented to person, time, or place in the environment 04/26/2024 1444 by Sebastian Agent, RN Outcome: Not Met/ Adequate for Discharge 04/26/2024 0851 by Sebastian Agent, RN Outcome: Progressing   Problem: Inappropriate Behavior Goal: The patient  will have behavior appropriate to place and/or for the person e.g. pulling at tubes or dressings, attempting to get out of bed when that is contraindicated and the like 04/26/2024 1444 by Sebastian Agent, RN Outcome: Not Met/ Adequate for Discharge 04/26/2024 0851 by Sebastian Agent, RN Outcome: Progressing   Problem: Inappropriate Communication Goal: The patient will have communication appropriate to place and/or for the person e.g. incoherence, non-communicative, nonsensical, or unintelligible speech 04/26/2024 1444 by Sebastian Agent, RN Outcome: Not Met/ Adequate for Discharge 04/26/2024 0851 by Sebastian Agent, RN Outcome: Progressing   Problem: Illusions/Hallucinations Goal: The patient will no longer see or hear things that are not there, distortion of visual objects 04/26/2024 1444 by Sebastian Agent, RN Outcome: Not Met/ Adequate for Discharge 04/26/2024 0851 by Sebastian Agent, RN Outcome: Progressing   Problem: Psychomotor Delay Goal: The patient will no longer have delayed responsiveness, few or spontaneous actions/word e.g. when patient is prodded, reaction is deferred and/or the patient is unarousable 04/26/2024 1444 by Sebastian Agent, RN Outcome: Not Met/ Adequate for Discharge 04/26/2024 0851 by Sebastian Agent, RN Outcome: Progressing   "

## 2024-04-26 NOTE — Progress Notes (Signed)
"  °  [facility]  Physical Therapy  ATTEMPTED VISIT NOTE  Patient Name:  Courtney Archer Room/Bed: 8A12/8A12-01    Referral received and attempt made to initiate evaluation.   Upon arrival at room, RN notified PT/OT of patient anticipating transfer back to prior facility today. PT orders have been discontinued by provider team. Please do not hesitate to re-consult PT if further needs arise.  HOPE REYNOLDS, PT      "

## 2024-04-26 NOTE — Discharge Summary (Addendum)
 Discharge to Outside Facility: RN  Report called to [facility] by Lynwood DASEN at 1330 Discharge Unit: 8A12/8A12-01 Discharge Unit Phone #: [phone]  Transport Equipment/Life Support Measures Needed    IV Access:     Peripheral IV 03/30/24 Right Forearm (Active)  Number of days: 27     Peripheral IV 04/25/24 Distal;Right Basilic;Forearm (Active)  Site Assessment Clean, Dry, Intact 04/26/24 0800  Joint Stabilization None 04/26/24 0800  Line Status/ Intervention Saline flushed;Saline locked;Patent 04/26/24 0800  Needleless Connector Change 04/25/24 04/25/24 2000  Dressing Type Transparent 04/26/24 0800  Dressing Status/ Intervention Clean, Dry, Intact 04/26/24 0800  Dressing Last Changed 04/25/24 04/25/24 2000  Number of days: 1     Peripheral IV 04/25/24 Left Wrist (Active)  Site Assessment Clean, Dry, Intact 04/26/24 0800  Joint Stabilization None 04/26/24 0800  Line Status/ Intervention Saline flushed;Saline locked;Patent 04/26/24 0800  Needleless Connector Change 04/25/24 04/25/24 2000  Dressing Type Transparent 04/26/24 0800  Dressing Status/ Intervention Clean, Dry, Intact 04/26/24 0800  Dressing Last Changed 04/25/24 04/25/24 2000  Number of days: 1                    Oxygen: via     Pulse Oximeter: SpO2: 92 % (04/26/24 1200)    Drains/Tubes:          Precautions/Positioning:    Vital Signs and Patient Condition At Time of Transfer    Vitals:     Vitals:   04/26/24 1200  BP: 132/85  Pulse: 95  Resp: 17  Temp: 36.6 C (97.9 F)     Mental Status:   (WDL = Within Defined Limits)           Orientation Level: Unable to assess (04/26/24 0800)   Cognition: Unable to assess due to altered mental status (04/26/24 0800)   Communication: Nonverbal (04/26/24 0800)   Pain:    Pain Assessment %%: PAINAD (04/26/24 0800)    Pain Score %%: 0-No pain (04/25/24 2000)    PAINAD Score: 0 (04/26/24 0800)  Nursing Summary    Activities of Daily  Living:        Mobility: Very limited (04/26/24 0800)   Activity: Chairfast (04/26/24 0800)   Sensory Aides:            Elimination:           Urinary Incontinence: Yes (04/25/24 1915)       Nutrition:   Does the pt require assistance with eating?: Total assist (04/26/24 0800)           Skin Condition / Wounds:     Wound 04/25/24 Right Scalp (Active)  Wound Assessment Dressing change not due 04/25/24 1230  Peri-wound Assessment Clean, Dry, Intact 04/25/24 1230  Closure Staples;Sutures 04/26/24 0800  Drainage Amount Small 04/25/24 1230  Drainage Description Serosanguineous 04/25/24 1230  Specialty Dressing Type Non-adherent wound contact layer;Xeroform;Transparent film 04/26/24 0800  Dressing Status/ Intervention Old drainage 04/26/24 0800  Number of days: 1     Wound 04/25/24 Lower;Right Abdomen (Active)  Wound Assessment Pink 04/25/24 1230  Peri-wound Assessment Clean, Dry, Intact 04/25/24 1230  Closure Sutures;Surgical glue 04/26/24 0800  Drainage Amount None 04/25/24 1230  Dressing Type Open to air 04/26/24 0800  Dressing Status/ Intervention Clean, Dry, Intact 04/26/24 0800  Number of days: 1     Wound 04/25/24 Right Head/ Skull (Active)  Wound Assessment Dressing change not due 04/25/24 1230  Peri-wound Assessment Clean, Dry, Intact 04/25/24 1230  Closure Sutures;Staples 04/26/24  0800  Drainage Amount Small 04/25/24 1230  Drainage Description Serosanguineous 04/25/24 1230  Specialty Dressing Type Non-adherent wound contact layer;Xeroform;Transparent film 04/26/24 0800  Dressing Status/ Intervention Old drainage 04/26/24 0800  Number of days: 1    (RN to validate provider order for wound care)  Signed By: LYNWOOD HUMMER

## 2024-04-29 ENCOUNTER — Emergency Department

## 2024-04-29 ENCOUNTER — Emergency Department
Admission: EM | Admit: 2024-04-29 | Discharge: 2024-04-29 | Disposition: A | Discharge status: Other Acute Inpatient Hospital | Attending: Emergency Medicine | Admitting: Emergency Medicine

## 2024-04-29 ENCOUNTER — Other Ambulatory Visit: Payer: Self-pay

## 2024-04-29 DIAGNOSIS — S065XAA Traumatic subdural hemorrhage with loss of consciousness status unknown, initial encounter: Secondary | ICD-10-CM | POA: Diagnosis not present

## 2024-04-29 DIAGNOSIS — S0990XA Unspecified injury of head, initial encounter: Secondary | ICD-10-CM | POA: Diagnosis present

## 2024-04-29 DIAGNOSIS — W06XXXA Fall from bed, initial encounter: Secondary | ICD-10-CM | POA: Insufficient documentation

## 2024-04-29 LAB — BASIC METABOLIC PANEL WITH GFR
Anion gap: 8 (ref 5–15)
BUN: 12 mg/dL (ref 8–23)
CO2: 26 mmol/L (ref 22–32)
Calcium: 8.7 mg/dL — ABNORMAL LOW (ref 8.9–10.3)
Chloride: 104 mmol/L (ref 98–111)
Creatinine, Ser: 0.4 mg/dL — ABNORMAL LOW (ref 0.44–1.00)
GFR, Estimated: >60 mL/min
Glucose, Bld: 109 mg/dL — ABNORMAL HIGH (ref 70–99)
Potassium: 3.6 mmol/L (ref 3.5–5.1)
Sodium: 138 mmol/L (ref 135–145)

## 2024-04-29 LAB — CBC WITH DIFFERENTIAL/PLATELET
Abs Immature Granulocytes: 0.13 10*3/uL — ABNORMAL HIGH (ref 0.00–0.07)
Basophils Absolute: 0 10*3/uL (ref 0.0–0.1)
Basophils Relative: 0 %
Eosinophils Absolute: 0.2 10*3/uL (ref 0.0–0.5)
Eosinophils Relative: 1 %
HCT: 33.3 % — ABNORMAL LOW (ref 36.0–46.0)
Hemoglobin: 10.8 g/dL — ABNORMAL LOW (ref 12.0–15.0)
Immature Granulocytes: 1 %
Lymphocytes Relative: 10 %
Lymphs Abs: 1.3 10*3/uL (ref 0.7–4.0)
MCH: 29.9 pg (ref 26.0–34.0)
MCHC: 32.4 g/dL (ref 30.0–36.0)
MCV: 92.2 fL (ref 80.0–100.0)
Monocytes Absolute: 1.1 10*3/uL — ABNORMAL HIGH (ref 0.1–1.0)
Monocytes Relative: 8 %
Neutro Abs: 10.6 10*3/uL — ABNORMAL HIGH (ref 1.7–7.7)
Neutrophils Relative %: 80 %
Platelets: 262 10*3/uL (ref 150–400)
RBC: 3.61 MIL/uL — ABNORMAL LOW (ref 3.87–5.11)
RDW: 14.6 % (ref 11.5–15.5)
WBC: 13.3 10*3/uL — ABNORMAL HIGH (ref 4.0–10.5)
nRBC: 0 % (ref 0.0–0.2)

## 2024-04-29 NOTE — ED Provider Notes (Signed)
 8:16 AM Assumed care for off going team.   Blood pressure (!) 141/83, pulse 62, temperature 98 F (36.7 C), temperature source Oral, resp. rate 16, height 5' 5 (1.651 m), weight 48.9 kg, SpO2 97%.  See their HPI for full report but in brief pending transfer to [facility]  Patient was accepted ED to the ED but there is a high probability that patient would not go over today.  Did let the neurosurgical team  Dr Clois here know in case there is any other thing that needs to be done while waiting transfer.  NO need for repeat CT just continue to wait for transfer.        Ernest Ronal BRAVO, MD 04/29/24 [phone]

## 2024-04-29 NOTE — ED Notes (Signed)
 Pt returned from CT at this time.

## 2024-04-29 NOTE — ED Notes (Signed)
Fall bundle in place

## 2024-04-29 NOTE — ED Triage Notes (Addendum)
 Pt arrives from [facility] via [facility].  C/O unwitnessed fall rolled out of bed. Rt side head hit in area of shunt placed this past week at [facility]. No LOC per witness to EMS.  No blood thinners.Pt A&Ox2 per baseline.  Denies pain.

## 2024-04-29 NOTE — ED Notes (Signed)
 Nat from transfer center [phone]) called this RN for update

## 2024-04-29 NOTE — ED Notes (Signed)
 Per Apolinar at [facility], this will be an ED to ED Transfer.  Call report to [phone].  Transfer to [address] [facility] location, accepting physician Alexa Bramall neurosurgery.  Per Samule at [facility], will add to transfer list and states that high probability that no transfer will be available today. Requested to be contacted if we find alternative transportation.

## 2024-04-29 NOTE — ED Provider Notes (Signed)
 "  [facility] Provider Note    Event Date/Time   First MD Initiated Contact with Patient 04/29/24 0302     (approximate)   History   Fall   HPI  Courtney Archer is a 79 y.o. 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  Per the patient's family members, she is out of facility and apparently fell out of bed onto the floor.  She was found down by facility staff.  They state that she had been nonverbal and essentially nonmobile before the VP shunt was placed a few days ago, and that she has improved much more than was expected but now becoming more mobile.  The patient is alert and able to answer few basic questions.  She denies a headache or other acute pain.  I reviewed the past medical records.  The patient was admitted to [facility] neurosurgery this past week and had a VP shunt placed for communicating hydrocephalus.   Physical Exam   Triage Vital Signs: ED Triage Vitals  Encounter Vitals Group     BP 04/29/24 0239 (!) 143/81     Girls Systolic BP Percentile --      Girls Diastolic BP Percentile --      Boys Systolic BP Percentile --      Boys Diastolic BP Percentile --      Pulse Rate 04/29/24 0239 64     Resp 04/29/24 0239 16     Temp 04/29/24 0239 98.1 F (36.7 C)     Temp Source 04/29/24 0239 Oral     SpO2 04/29/24 0239 97 %     Weight 04/29/24 0240 107 lb 11.2 oz (48.9 kg)     Height 04/29/24 0240 5' 5 (1.651 m)     Head Circumference --      Peak Flow --      Pain Score 04/29/24 0240 0     Pain Loc --      Pain Education --      Exclude from Growth Chart --     Most recent vital signs: Vitals:   04/29/24 0239 04/29/24 0610  BP: (!) 143/81 106/63  Pulse: 64 (!) 58  Resp: 16 16  Temp: 98.1 F (36.7 C)   SpO2: 97% 96%     General: Alert, oriented x 2, no distress.  CV:  Good peripheral perfusion.  Resp:  Normal effort.  Abd:  No  distention.  Other:  EOMI.  PERRLA.  No facial droop.  Motor intact in all extremities.  Incision site light to right parietal scalp clean, dry and intact.  No palpable hematoma.   ED Results / Procedures / Treatments   Labs (all labs ordered are listed, but only abnormal results are displayed) Labs Reviewed  BASIC METABOLIC PANEL WITH GFR - Abnormal; Notable for the following components:      Result Value   Glucose, Bld 109 (*)    Creatinine, Ser 0.40 (*)    Calcium 8.7 (*)    All other components within normal limits  CBC WITH DIFFERENTIAL/PLATELET - Abnormal; Notable for the following components:   WBC 13.3 (*)    RBC 3.61 (*)    Hemoglobin 10.8 (*)    HCT 33.3 (*)    Neutro Abs 10.6 (*)    Monocytes Absolute 1.1 (*)    Abs Immature Granulocytes 0.13 (*)    All other components within normal limits  EKG     RADIOLOGY  CT head: I independently viewed and interpreted the images; there is a right sided subdural hematoma.  Radiology report indicates the following:  IMPRESSION:  1. Mixed density collection along the right cerebral convexity compatible with  chronic subdural hematoma with pneumocephalus and 2 mm leftward midline shift.  2. Moderate hydrocephalus with right frontal ventricular shunt catheter in  place.  3. Chronic microvascular ischemic disease.   CT cervical spine:  IMPRESSION:  1. No evidence of acute traumatic injury.  2. Chronic degenerative disc disease at C3-4, C4-5, C5-6, and C6-7 with disc  space narrowing and mild posterior endplate ridging.  3. 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  4. Right-sided ventriculoperitoneal shunt catheter with adjacent air bubbles,  compatible with recent placement.    PROCEDURES:  Critical Care performed: No  Procedures   MEDICATIONS ORDERED IN ED: Medications - No data to display   IMPRESSION / MDM / ASSESSMENT AND PLAN / ED COURSE  I reviewed  the triage vital signs and the nursing notes.  79 year old female with PMH as noted above presents after an apparent fall out of bed with concern for possible head injury.  She just had a VP shunt placed 3 days ago.  On exam  Differential diagnosis includes, but is not limited to, minor head injury, concussion, TBI, hematoma or other postsurgical complication.  Patient's presentation is most consistent with acute complicated illness / injury requiring diagnostic workup.  Will obtain CT head and cervical spine and reassess.  There is no indication for shunt series at this time.  ----------------------------------------- 6:07 AM on 04/29/2024 -----------------------------------------  CT shows a subdural hematoma although per the radiology report it appears chronic.  There is 2 mm of midline shift.  This differs from the CT the patient had  at [facility] on 1/28 after the surgery.  I consulted and discussed case with Dr. Clois from neurosurgery who recommends transfer to [facility] as this is a postsurgical complication.  I have asked the secretary to reach out to the [facility] transfer center.  ----------------------------------------- 7:01 AM on 04/29/2024 -----------------------------------------  I spoke to [facility] transfer center and we are awaiting callback from the provider.  I have signed the patient out to the oncoming physician Dr. Ernest.   FINAL CLINICAL IMPRESSION(S) / ED DIAGNOSES   Final diagnoses:  Subdural hematoma (HCC)     Rx / DC Orders   ED Discharge Orders     None        Note:  This document was prepared using Dragon voice recognition software and may include unintentional dictation errors.    Jacolyn Pae, MD 04/29/24 [phone]  "

## 2024-04-29 NOTE — ED Notes (Signed)
 EMTALA Reviewed by this RN and transfer consent verified.

## 2024-04-29 NOTE — ED Notes (Signed)
 This RN verifying pt's husband contact info. Husband leaving to take son home.

## 2024-04-29 NOTE — ED Notes (Signed)
 Patient transported to CT

## 2024-05-16 ENCOUNTER — Ambulatory Visit: Payer: Medicare HMO | Admitting: Dermatology
